# Patient Record
Sex: Female | Born: 1975 | Race: Black or African American | Hispanic: No | Marital: Married | Smoking: Never smoker
Health system: Southern US, Community
[De-identification: ages and names within clinical notes are randomized; demographics above are authoritative.]

## PROBLEM LIST (undated history)

## (undated) ENCOUNTER — Ambulatory Visit (HOSPITAL_COMMUNITY): Admission: EM | Payer: Managed Care, Other (non HMO)

## (undated) ENCOUNTER — Inpatient Hospital Stay (HOSPITAL_COMMUNITY): Payer: Self-pay

## (undated) DIAGNOSIS — Z789 Other specified health status: Secondary | ICD-10-CM

## (undated) DIAGNOSIS — T4145XA Adverse effect of unspecified anesthetic, initial encounter: Secondary | ICD-10-CM

## (undated) DIAGNOSIS — T8859XA Other complications of anesthesia, initial encounter: Secondary | ICD-10-CM

## (undated) HISTORY — PX: TUBAL LIGATION: SHX77

---

## 2009-07-14 ENCOUNTER — Ambulatory Visit (HOSPITAL_COMMUNITY): Admission: RE | Admit: 2009-07-14 | Discharge: 2009-07-14 | Payer: Self-pay | Admitting: Obstetrics and Gynecology

## 2010-03-29 ENCOUNTER — Encounter: Payer: Self-pay | Admitting: Obstetrics and Gynecology

## 2011-01-12 LAB — OB RESULTS CONSOLE ABO/RH

## 2011-01-12 LAB — OB RESULTS CONSOLE ANTIBODY SCREEN: Antibody Screen: NEGATIVE

## 2011-02-22 ENCOUNTER — Encounter (HOSPITAL_COMMUNITY): Payer: Self-pay

## 2011-02-22 ENCOUNTER — Inpatient Hospital Stay (HOSPITAL_COMMUNITY)
Admission: AD | Admit: 2011-02-22 | Discharge: 2011-02-22 | Disposition: A | Discharge status: Home / Self Care | Payer: Managed Care, Other (non HMO) | Source: Ambulatory Visit | Attending: Obstetrics and Gynecology | Admitting: Obstetrics and Gynecology

## 2011-02-22 DIAGNOSIS — O210 Mild hyperemesis gravidarum: Secondary | ICD-10-CM | POA: Insufficient documentation

## 2011-02-22 DIAGNOSIS — R1013 Epigastric pain: Secondary | ICD-10-CM | POA: Insufficient documentation

## 2011-02-22 HISTORY — DX: Other specified health status: Z78.9

## 2011-02-22 LAB — CBC
MCHC: 33.8 g/dL (ref 30.0–36.0)
Platelets: 178 10*3/uL (ref 150–400)
RDW: 12.8 % (ref 11.5–15.5)
WBC: 3.7 10*3/uL — ABNORMAL LOW (ref 4.0–10.5)

## 2011-02-22 LAB — URINALYSIS, ROUTINE W REFLEX MICROSCOPIC
Leukocytes, UA: NEGATIVE
Protein, ur: 100 mg/dL — AB
Specific Gravity, Urine: 1.025 (ref 1.005–1.030)
pH: 6.5 (ref 5.0–8.0)

## 2011-02-22 LAB — COMPREHENSIVE METABOLIC PANEL
ALT: 61 U/L — ABNORMAL HIGH (ref 0–35)
AST: 56 U/L — ABNORMAL HIGH (ref 0–37)
Albumin: 3.1 g/dL — ABNORMAL LOW (ref 3.5–5.2)
Alkaline Phosphatase: 83 U/L (ref 39–117)
BUN: 5 mg/dL — ABNORMAL LOW (ref 6–23)
CO2: 23 mEq/L (ref 19–32)
Calcium: 9.3 mg/dL (ref 8.4–10.5)
Chloride: 96 mEq/L (ref 96–112)
Creatinine, Ser: 0.54 mg/dL (ref 0.50–1.10)
GFR calc Af Amer: >90 mL/min (ref >90)
GFR calc non Af Amer: >90 mL/min (ref >90)
Glucose, Bld: 84 mg/dL (ref 70–99)
Potassium: 3.2 mEq/L — ABNORMAL LOW (ref 3.5–5.1)
Sodium: 132 mEq/L — ABNORMAL LOW (ref 135–145)
Total Bilirubin: 0.4 mg/dL (ref 0.3–1.2)
Total Protein: 7.4 g/dL (ref 6.0–8.3)

## 2011-02-22 LAB — URINE MICROSCOPIC-ADD ON

## 2011-02-22 MED ORDER — RANITIDINE HCL 150 MG PO CAPS: 150.0000 mg | ORAL_CAPSULE | Freq: Every day | ORAL | Status: DC

## 2011-02-22 MED ORDER — ONDANSETRON HCL 4 MG/2ML IJ SOLN
4.0000 mg | Freq: Once | INTRAMUSCULAR | Status: AC
  Administered 2011-02-22: 4 mg via INTRAVENOUS
  Filled 2011-02-22: qty 2

## 2011-02-22 MED ORDER — LACTATED RINGERS IV BOLUS (SEPSIS)
1000.0000 mL | Freq: Once | INTRAVENOUS | Status: AC
  Administered 2011-02-22: 1000 mL via INTRAVENOUS

## 2011-02-22 MED ORDER — FAMOTIDINE IN NACL 20-0.9 MG/50ML-% IV SOLN
20.0000 mg | Freq: Once | INTRAVENOUS | Status: AC
  Administered 2011-02-22: 20 mg via INTRAVENOUS
  Filled 2011-02-22: qty 50

## 2011-02-22 NOTE — ED Provider Notes (Signed)
History     CSN: [id number] Arrival date & time: 02/22/2011  2:49 PM   None     Chief Complaint  Patient presents with  . Dehydration    HPI Shawna Morris is a 35 y.o. female @ [redacted]w[redacted]d gestation who presents to MAU for dehydration. She states that she has had nausea and vomiting since early pregnancy but today the medication has not helped.  Past Medical History  Diagnosis Date  . No pertinent past medical history     Past Surgical History  Procedure Date  . Cesarean section     History reviewed. No pertinent family history.  History  Substance Use Topics  . Smoking status: Never Smoker   . Smokeless tobacco: Not on file  . Alcohol Use: No    OB History    Grav Para Term Preterm Abortions TAB SAB Ect Mult Living   4 3 3       3       Review of Systems  Constitutional: Positive for appetite change. Negative for fever, chills and activity change.  HENT: Negative.   Eyes: Negative.   Respiratory: Negative.   Cardiovascular: Negative.   Gastrointestinal: Positive for nausea and vomiting. Negative for diarrhea and constipation.       Epigastric pain.  Genitourinary: Positive for frequency. Negative for dysuria, vaginal bleeding, vaginal discharge and pelvic pain.       [redacted] weeks gestation.  Skin: Negative.   Neurological: Positive for light-headedness.  Psychiatric/Behavioral: Negative for confusion and agitation. The patient is not nervous/anxious.     Allergies  Aspirin  Home Medications  No current outpatient prescriptions on file.  BP 98/59  Pulse 85  Temp(Src) 99.2 F (37.3 C) (Oral)  Resp 18  Ht 5\' 6"  (1.676 m)  Wt 177 lb 6 oz (80.457 kg)  BMI 28.63 kg/m2  Physical Exam  Nursing note and vitals reviewed. Constitutional: She is oriented to person, place, and time. She appears well-developed and well-nourished.  HENT:  Head: Normocephalic and atraumatic.  Eyes: EOM are normal.  Neck: Neck supple.  Cardiovascular: Normal rate.     Pulmonary/Chest: Effort normal.  Abdominal: There is tenderness in the epigastric area.  Musculoskeletal: Normal range of motion.  Neurological: She is alert and oriented to person, place, and time. No cranial nerve deficit.  Skin: Skin is warm and dry.  Psychiatric: She has a normal mood and affect. Her behavior is normal. Judgment and thought content normal.   Assessment: Hyperemesis @ [redacted] weeks gestation   Epigastric pain  Plan:  IV Fluids   Antiemetics   Pepcid   ED Course  Procedures   MDM          Kerrie Buffalo, NP 02/22/11 1937

## 2011-02-22 NOTE — Progress Notes (Signed)
Pt having much spitting and vomiting today.  Called office and was told to come here for IV fluids.

## 2011-02-22 NOTE — Progress Notes (Signed)
Pt states n/v x1week, ptyalism, has dull h/a at present. Here for iv fluids.

## 2011-06-25 ENCOUNTER — Inpatient Hospital Stay (HOSPITAL_COMMUNITY): Payer: BC Managed Care – PPO

## 2011-06-25 ENCOUNTER — Encounter (HOSPITAL_COMMUNITY): Payer: Self-pay

## 2011-06-25 ENCOUNTER — Inpatient Hospital Stay (HOSPITAL_COMMUNITY)
Admission: AD | Admit: 2011-06-25 | Discharge: 2011-06-25 | Disposition: A | Discharge status: Home / Self Care | Payer: BC Managed Care – PPO | Source: Ambulatory Visit | Attending: Obstetrics and Gynecology | Admitting: Obstetrics and Gynecology

## 2011-06-25 DIAGNOSIS — D539 Nutritional anemia, unspecified: Secondary | ICD-10-CM | POA: Insufficient documentation

## 2011-06-25 DIAGNOSIS — R112 Nausea with vomiting, unspecified: Secondary | ICD-10-CM

## 2011-06-25 DIAGNOSIS — O99019 Anemia complicating pregnancy, unspecified trimester: Secondary | ICD-10-CM | POA: Insufficient documentation

## 2011-06-25 DIAGNOSIS — O212 Late vomiting of pregnancy: Secondary | ICD-10-CM | POA: Insufficient documentation

## 2011-06-25 LAB — COMPREHENSIVE METABOLIC PANEL
AST: 15 U/L (ref 0–37)
BUN: 4 mg/dL — ABNORMAL LOW (ref 6–23)
CO2: 23 mEq/L (ref 19–32)
Calcium: 8.6 mg/dL (ref 8.4–10.5)
Chloride: 103 mEq/L (ref 96–112)
Creatinine, Ser: 0.61 mg/dL (ref 0.50–1.10)
GFR calc non Af Amer: >90 mL/min (ref >90)
Total Bilirubin: 0.5 mg/dL (ref 0.3–1.2)

## 2011-06-25 LAB — T3, FREE: T3, Free: 2.6 pg/mL (ref 2.3–4.2)

## 2011-06-25 LAB — TSH: TSH: 1.277 u[IU]/mL (ref 0.350–4.500)

## 2011-06-25 LAB — CBC
HCT: 47.6 % — ABNORMAL HIGH (ref 36.0–46.0)
MCH: 26.8 pg (ref 26.0–34.0)
MCV: 133 fL — ABNORMAL HIGH (ref 78.0–100.0)
Platelets: 207 10*3/uL (ref 150–400)
RDW: 16.1 % — ABNORMAL HIGH (ref 11.5–15.5)
WBC: 5.8 10*3/uL (ref 4.0–10.5)

## 2011-06-25 LAB — T4, FREE: Free T4: 1.12 ng/dL (ref 0.80–1.80)

## 2011-06-25 MED ORDER — PROMETHAZINE HCL 25 MG PO TABS: 25.0000 mg | ORAL_TABLET | Freq: Four times a day (QID) | ORAL | Status: DC | PRN

## 2011-06-25 MED ORDER — LACTATED RINGERS IV SOLN
Freq: Once | INTRAVENOUS | Status: AC
  Administered 2011-06-25: 12:00:00 via INTRAVENOUS

## 2011-06-25 NOTE — Discharge Instructions (Signed)
Anemia, Frequently Asked Questions WHAT ARE THE SYMPTOMS OF ANEMIA?  Headache.   Difficulty thinking.   Fatigue.   Shortness of breath.   Weakness.   Rapid heartbeat.  AT WHAT POINT ARE PEOPLE CONSIDERED ANEMIC?  This varies with gender and age.   Both hemoglobin (Hgb) and hematocrit values are used to define anemia. These lab values are obtained from a complete blood count (CBC) test. This is performed at a caregiver's office.   The normal range of hemoglobin values for adult men is 14.0 g/dL to 17.4 g/dL. For nonpregnant women, values are 12.3 g/dL to 15.3 g/dL.   The World Health Organization defines anemia as less than 12 g/dL for nonpregnant women and less than 13 g/dL for men.   For adult males, the average normal hematocrit is 46%, and the range is 40% to 52%.   For adult females, the average normal hematocrit is 41%, and the range is 35% to 47%.   Values that fall below the lower limits can be a sign of anemia and should have further checking (evaluation).  GROUPS OF PEOPLE WHO ARE AT RISK FOR DEVELOPING ANEMIA INCLUDE:   Infants who are breastfed or taking a formula that is not fortified with iron.   Children going through a rapid growth spurt. The iron available can not keep up with the needs for a red cell mass which must grow with the child.   Women in childbearing years. They need iron because of blood loss during menstruation.   Pregnant women. The growing fetus creates a high demand for iron.   People with ongoing gastrointestinal blood loss are at risk of developing iron deficiency.   Individuals with leukemia or cancer who must receive chemotherapy or radiation to treat their disease. The drugs or radiation used to treat these diseases often decreases the bone marrow's ability to make cells of all classes. This includes red blood cells, white blood cells, and platelets.   Individuals with chronic inflammatory conditions such as rheumatoid arthritis or  chronic infections.   The elderly.  ARE SOME TYPES OF ANEMIA INHERITED?   Yes, some types of anemia are due to inherited or genetic defects.   Sickle cell anemia. This occurs most often in people of African, African American, and Mediterranean descent.   Thalassemia (or Cooley's anemia). This type is found in people of Mediterranean and Southeast Asian descent. These types of anemia are common.   [place]. This is rare.  CAN CERTAIN MEDICATIONS CAUSE A PERSON TO BECOME ANEMIC?  Yes. For example, drugs to fight cancer (chemotherapeutic agents) often cause anemia. These drugs can slow the bone marrow's ability to make red blood cells. If there are not enough red blood cells, the body does not get enough oxygen. WHAT HEMATOCRIT LEVEL IS REQUIRED TO DONATE BLOOD?  The lower limit of an acceptable hematocrit for blood donors is 38%. If you have a low hematocrit value, you should schedule an appointment with your caregiver. ARE BLOOD TRANSFUSIONS COMMONLY USED TO CORRECT ANEMIA, AND ARE THEY DANGEROUS?  They are used to treat anemia as a last resort. Your caregiver will find the cause of the anemia and correct it if possible. Most blood transfusions are given because of excessive bleeding at the time of surgery, with trauma, or because of bone marrow suppression in patients with cancer or leukemia on chemotherapy. Blood transfusions are safer than ever before. We also know that blood transfusions affect the immune system and may increase certain risks. There is   also a concern for human error. In 1/16,000 transfusions, a patient receives a transfusion of blood that is not matched with his or her blood type.  WHAT IS IRON DEFICIENCY ANEMIA AND CAN I CORRECT IT BY CHANGING MY DIET?  Iron is an essential part of hemoglobin. Without enough hemoglobin, anemia develops and the body does not get the right amount of oxygen. Iron deficiency anemia develops after the body has had a low level of iron for a long  time. This is either caused by blood loss, not taking in or absorbing enough iron, or increased demands for iron (like pregnancy or rapid growth).  Foods from animal origin such as beef, chicken, and pork, are good sources of iron. Be sure to have one of these foods at each meal. Vitamin C helps your body absorb iron. Foods rich in Vitamin C include citrus, bell pepper, strawberries, spinach and cantaloupe. In some cases, iron supplements may be needed in order to correct the iron deficiency. In the case of poor absorption, extra iron may have to be given directly into the vein through a needle (intravenously). I HAVE BEEN DIAGNOSED WITH IRON DEFICIENCY ANEMIA AND MY CAREGIVER PRESCRIBED IRON SUPPLEMENTS. HOW LONG WILL IT TAKE FOR MY BLOOD TO BECOME NORMAL?  It depends on the degree of anemia at the beginning of treatment. Most people with mild to moderate iron deficiency, anemia will correct the anemia over a period of 2 to 3 months. But after the anemia is corrected, the iron stored by the body is still low. Caregivers often suggest an additional 6 months of oral iron therapy once the anemia has been reversed. This will help prevent the iron deficiency anemia from quickly happening again. Non-anemic adult males should take iron supplements only under the direction of a doctor, too much iron can cause liver damage.  MY HEMOGLOBIN IS 9 G/DL AND I AM SCHEDULED FOR SURGERY. SHOULD I POSTPONE THE SURGERY?  If you have Hgb of 9, you should discuss this with your caregiver right away. Many patients with similar hemoglobin levels have had surgery without problems. If minimal blood loss is expected for a minor procedure, no treatment may be necessary.  If a greater blood loss is expected for more extensive procedures, you should ask your caregiver about being treated with erythropoietin and iron. This is to accelerate the recovery of your hemoglobin to a normal level before surgery. An anemic patient who undergoes  high-blood-loss surgery has a greater risk of surgical complications and need for a blood transfusion, which also carries some risk.  I HAVE BEEN TOLD THAT HEAVY MENSTRUAL PERIODS CAUSE ANEMIA. IS THERE ANYTHING I CAN DO TO PREVENT THE ANEMIA?  Anemia that results from heavy periods is usually due to iron deficiency. You can try to meet the increased demands for iron caused by the heavy monthly blood loss by increasing the intake of iron-rich foods. Iron supplements may be required. Discuss your concerns with your caregiver. WHAT CAUSES ANEMIA DURING PREGNANCY?  Pregnancy places major demands on the body. The mother must meet the needs of both her body and her growing baby. The body needs enough iron and folate to make the right amount of red blood cells. To prevent anemia while pregnant, the mother should stay in close contact with her caregiver.  Be sure to eat a diet that has foods rich in iron and folate like liver and dark green leafy vegetables. Folate plays an important role in the normal development of a baby's   spinal cord. Folate can help prevent serious disorders like spina bifida. If your diet does not provide adequate nutrients, you may want to talk with your caregiver about nutritional supplements.  WHAT IS THE RELATIONSHIP BETWEEN FIBROID TUMORS AND ANEMIA IN WOMEN?  The relationship is usually caused by the increased menstrual blood loss caused by fibroids. Good iron intake may be required to prevent iron deficiency anemia from developing.  Document Released: 10/01/2003 Document Revised: 02/11/2011 Document Reviewed: 03/17/2010 [facility] Patient Information 2012 [place], [place].  B.R.A.T. Diet Your doctor has recommended the B.R.A.T. diet for you or your child until the condition improves. This is often used to help control diarrhea and vomiting symptoms. If you or your child can tolerate clear liquids, you may have:  Bananas.   Rice.   Applesauce.   Toast (and other simple starches  such as crackers, potatoes, noodles).  Be sure to avoid dairy products, meats, and fatty foods until symptoms are better. Fruit juices such as apple, grape, and prune juice can make diarrhea worse. Avoid these. Continue this diet for 2 days or as instructed by your caregiver. Document Released: 02/22/2005 Document Revised: 02/11/2011 Document Reviewed: 08/11/2006 [facility] Patient Information 2012 [place], [place].

## 2011-06-25 NOTE — MAU Provider Note (Signed)
History     CSN: [id number]  Arrival date and time: 06/25/11 1108   First Provider Initiated Contact with Patient 06/25/11 1200      No chief complaint on file.  HPI This is a 36 y.o. female G3 P2 at [redacted]w[redacted]d who presents after being evaluated in the office by Dr Henderson Cloud for c/o vomiting for 2 weeks, ptyalism, Difficulty catching her breath this morning (no longer feels this way), and weakness.  Lost 2 lbs as of this morning.   OB History    Grav Para Term Preterm Abortions TAB SAB Ect Mult Living   3 2 2  0 0 0 0 0 1 3      Past Medical History  Diagnosis Date  . No pertinent past medical history     Past Surgical History  Procedure Date  . Cesarean section     Family History  Problem Relation Age of Onset  . Anesthesia problems Neg Hx     History  Substance Use Topics  . Smoking status: Never Smoker   . Smokeless tobacco: Not on file  . Alcohol Use: No    Allergies:  Allergies  Allergen Reactions  . Aspirin Other (See Comments)    bleeding    Prescriptions prior to admission  Medication Sig Dispense Refill  . acetaminophen (TYLENOL) 325 MG tablet Take 650 mg by mouth every 6 (six) hours as needed. pain       . prenatal vitamin w/FE, FA (PRENATAL 1 + 1) 27-1 MG TABS Take 1 tablet by mouth daily.          Review of Systems  Constitutional: Positive for weight loss and malaise/fatigue. Negative for fever, chills and diaphoresis.  Gastrointestinal: Positive for nausea and vomiting. Negative for abdominal pain.  Genitourinary: Negative for dysuria.  Neurological: Positive for weakness.    Physical Exam   Blood pressure 113/88, pulse 90, temperature 97.6 F (36.4 C), temperature source Oral, resp. rate 16, height 5\' 6"  (1.676 m), weight 185 lb 3.2 oz (84.006 kg), SpO2 100.00%.  Physical Exam  Constitutional: She is oriented to person, place, and time. She appears well-developed and well-nourished. No distress.  HENT:  Head: Normocephalic.    Cardiovascular: Normal rate.   Respiratory: Effort normal.  GI: Soft. She exhibits no distension and no mass. There is no tenderness. There is no rebound and no guarding.  Musculoskeletal: Normal range of motion.  Neurological: She is alert and oriented to person, place, and time.  Skin: Skin is warm and dry.  Psychiatric: She has a normal mood and affect.   O2 sat 100%  Does not appear dyspneic FHR reassuring Irregular contractions and irritability, every 5-7 minutes, patient does not feel them Cervix Closed/Long/High  Results for orders placed during the hospital encounter of 06/25/11 (from the past 24 hour(s))  CBC     Status: Abnormal   Collection Time   06/25/11 11:55 AM      Component Value Range   WBC 5.8  4.0 - 10.5 (K/uL)   RBC 3.58 (*) 3.87 - 5.11 (MIL/uL)   Hemoglobin 9.6 (*) 12.0 - 15.0 (g/dL)   HCT 14.7 (*) 82.9 - 46.0 (%)   MCV 133.0 (*) 78.0 - 100.0 (fL)   MCH 26.8  26.0 - 34.0 (pg)   MCHC 20.2 (*) 30.0 - 36.0 (g/dL)   RDW 56.2 (*) 13.0 - 15.5 (%)   Platelets 207  150 - 400 (K/uL)  COMPREHENSIVE METABOLIC PANEL     Status: Abnormal  Collection Time   06/25/11 11:55 AM      Component Value Range   Sodium 135  135 - 145 (mEq/L)   Potassium 3.7  3.5 - 5.1 (mEq/L)   Chloride 103  96 - 112 (mEq/L)   CO2 23  19 - 32 (mEq/L)   Glucose, Bld 65 (*) 70 - 99 (mg/dL)   BUN 4 (*) 6 - 23 (mg/dL)   Creatinine, Ser 7.82  0.50 - 1.10 (mg/dL)   Calcium 8.6  8.4 - 95.6 (mg/dL)   Total Protein 6.1  6.0 - 8.3 (g/dL)   Albumin 2.5 (*) 3.5 - 5.2 (g/dL)   AST 15  0 - 37 (U/L)   ALT 8  0 - 35 (U/L)   Alkaline Phosphatase 108  39 - 117 (U/L)   Total Bilirubin 0.5  0.3 - 1.2 (mg/dL)   GFR calc non Af Amer >90  >90 (mL/min)   GFR calc Af Amer >90  >90 (mL/min)    Thyroid Scan from 2011:   Clinical Data: Evaluate thyroid nodules  THYROID ULTRASOUND  Technique: Ultrasound examination of the thyroid gland and  adjacent soft tissues was performed.  Comparison: None  Findings:  The right lobe of the thyroid gland measures 5.3 x 1.4 x  2.3 cm.  The left lobe of the thyroid gland measures 4.9 x 1.3 x 2.1 cm.  The isthmus measures 4 mm.  The echotexture of the thyroid gland is homogeneous.  There are multiple hypo echoic nodules with central echogenic foci  demonstrating ring down artifact. The largest is in the upper  pole of the right lobe measuring 1.1 x 0.6 x 0.5 cm. There are no  microcalcifications associated with these nodules. There is no  hypervascularity associated with these nodules.  IMPRESSION:  1. 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  2. No suspicious nodules or masses noted.    MAU Course  Procedures  MDM Per Dr Henderson Cloud:  Will check [place], Give IV, and check labs  Assessment and Plan  A:  Nausea Improved, Hungry      Macrocytic, Hypochromic Anemia      No further Respiratory issue  P: Discussed results with Dr Henderson Cloud.       Will check Thyroid Panel due to macrocytic anemia      Recommend continue Iron       Discharge home      Followup in office        [facility] 06/25/2011, 12:01 PM

## 2011-06-25 NOTE — MAU Note (Signed)
Patient states she is scheduled for a repeat cesarean section on 6-4.

## 2011-06-25 NOTE — MAU Note (Signed)
Patient states she has been having vomiting in the morning for about 2 weeks. Has also been having difficulty "catching her breath" for the same 2 weeks. States she was seen in the office today by Dr. Henderson Cloud and sent to MAU for evaluation.

## 2011-07-16 ENCOUNTER — Inpatient Hospital Stay (HOSPITAL_COMMUNITY): Admission: AD | Admit: 2011-07-16 | Payer: Managed Care, Other (non HMO) | Admitting: Obstetrics and Gynecology

## 2011-07-26 ENCOUNTER — Encounter (HOSPITAL_COMMUNITY): Payer: Self-pay | Admitting: Pharmacist

## 2011-08-03 ENCOUNTER — Encounter (HOSPITAL_COMMUNITY): Payer: Self-pay

## 2011-08-04 ENCOUNTER — Encounter (HOSPITAL_COMMUNITY): Payer: Self-pay

## 2011-08-04 ENCOUNTER — Encounter (HOSPITAL_COMMUNITY)
Admission: RE | Admit: 2011-08-04 | Discharge: 2011-08-04 | Disposition: A | Discharge status: Home / Self Care | Payer: BC Managed Care – PPO | Source: Ambulatory Visit | Attending: Obstetrics and Gynecology | Admitting: Obstetrics and Gynecology

## 2011-08-04 HISTORY — DX: Other complications of anesthesia, initial encounter: T88.59XA

## 2011-08-04 HISTORY — DX: Adverse effect of unspecified anesthetic, initial encounter: T41.45XA

## 2011-08-04 LAB — CBC
MCHC: 31.4 g/dL (ref 30.0–36.0)
Platelets: 215 10*3/uL (ref 150–400)
RDW: 15.7 % — ABNORMAL HIGH (ref 11.5–15.5)
WBC: 5.9 10*3/uL (ref 4.0–10.5)

## 2011-08-04 LAB — SURGICAL PCR SCREEN
MRSA, PCR: NEGATIVE
Staphylococcus aureus: NEGATIVE

## 2011-08-04 LAB — RPR: RPR Ser Ql: NONREACTIVE

## 2011-08-04 NOTE — Patient Instructions (Addendum)
20 Shawna Morris  08/04/2011   Your procedure is scheduled on:  08/12/11  Enter through the Main Entrance of [facility] at 6 AM.  Pick up the phone at the desk and dial [phone].   Call this number if you have problems the morning of surgery: [phone]   Remember:   Do not eat food:After Midnight.  Do not drink clear liquids: After Midnight.  Take these medicines the morning of surgery with A SIP OF WATER: NA   Do not wear jewelry, make-up or nail polish.  Do not wear lotions, powders, or perfumes. You may wear deodorant.  Do not shave 48 hours prior to surgery.  Do not bring valuables to the hospital.  Contacts, dentures or bridgework may not be worn into surgery.  Leave suitcase in the car. After surgery it may be brought to your room.  For patients admitted to the hospital, checkout time is 11:00 AM the day of discharge.   Patients discharged the day of surgery will not be allowed to drive home.  Name and phone number of your driver: NA  Special Instructions: CHG Shower Use Special Wash: 1/2 bottle night before surgery and 1/2 bottle morning of surgery.   Please read over the following fact sheets that you were given: MRSA Information

## 2011-08-10 NOTE — H&P (Signed)
William Laske is a 36 y.o. female presenting for repeat cesarean section at 39.5 weeks. Two prior c/s. PNC uncomplicated.  Negative GBS. Desires permanent sterilization. Maternal Medical History:  Prenatal Complications - Diabetes: none.    OB History    Grav Para Term Preterm Abortions TAB SAB Ect Mult Living   3 2 2  0 0 0 0 0 1 3     Past Medical History  Diagnosis Date  . No pertinent past medical history   . Complication of anesthesia     states "last epidural did not work" wants to be "asleep"   Past Surgical History  Procedure Date  . Cesarean section    Family History: family history is negative for Anesthesia problems. Social History:  reports that she has never smoked. She does not have any smokeless tobacco history on file. She reports that she does not drink alcohol or use illicit drugs.  Review of Systems  All other systems reviewed and are negative.      There were no vitals taken for this visit. Maternal Exam:  Abdomen: Patient reports no abdominal tenderness. Surgical scars: low transverse.   Fundal height is c/w dates.   Estimated fetal weight is 7.5.   Fetal presentation: vertex     Physical Exam  Constitutional: She is oriented to person, place, and time. She appears well-developed and well-nourished.  HENT:  Head: Normocephalic.  Mouth/Throat: Oropharynx is clear and moist.  Eyes: Conjunctivae and EOM are normal. Pupils are equal, round, and reactive to light.  Neck: Normal range of motion.  Cardiovascular: Normal rate, regular rhythm and normal heart sounds.   Respiratory: Effort normal and breath sounds normal.  GI: Bowel sounds are normal.  Neurological: She is alert and oriented to person, place, and time. She has normal reflexes.    Prenatal labs: ABO, Rh: B/Positive/-- (11/06 0000) Antibody: Negative (11/06 0000) Rubella: Immune (11/06 0000) RPR: NON REACTIVE (05/29 1332)  HBsAg: Negative (11/06 0000)  HIV: Non-reactive (11/06 0000)   GBS:     Assessment/Plan: IUP at 39.5 for repeat cesarean section.  Desires sterility procede with repeat cesarean section at BTL. Risk of cesarean section discussed.  These include:  Risk of infection;  Risk of hemorrhage that could require transfusions with the associated risk of aids or hepatitis;  Excessive bleeding could require hysterectomy;  Risk of injury to adjacent organs including bladder, bowel or ureters;  Risk of DVT's and possible pulmonary embolus.  Patient expresses a understanding of indications and risks.; Alternative forms of birth control  Discussed. Potential irreversibility of sterilization explained.  Failure rate discussed which can be in the form of an ectopic pregnancy requiring suregery. Patient expresses understanding.  Alta Shober S 08/10/2011, 7:59 PM

## 2011-08-11 ENCOUNTER — Encounter (HOSPITAL_COMMUNITY)
Admission: AD | Disposition: A | Discharge status: Home / Self Care | Payer: Self-pay | Source: Ambulatory Visit | Attending: Obstetrics and Gynecology

## 2011-08-11 ENCOUNTER — Encounter (HOSPITAL_COMMUNITY): Payer: Self-pay | Admitting: Anesthesiology

## 2011-08-11 ENCOUNTER — Inpatient Hospital Stay (HOSPITAL_COMMUNITY)
Admission: AD | Admit: 2011-08-11 | Discharge: 2011-08-14 | DRG: 371 | Disposition: A | Discharge status: Home / Self Care | Payer: BC Managed Care – PPO | Source: Ambulatory Visit | Attending: Obstetrics and Gynecology | Admitting: Obstetrics and Gynecology

## 2011-08-11 ENCOUNTER — Encounter (HOSPITAL_COMMUNITY): Payer: Self-pay | Admitting: *Deleted

## 2011-08-11 ENCOUNTER — Inpatient Hospital Stay (HOSPITAL_COMMUNITY): Payer: BC Managed Care – PPO | Admitting: Anesthesiology

## 2011-08-11 DIAGNOSIS — Z302 Encounter for sterilization: Secondary | ICD-10-CM

## 2011-08-11 DIAGNOSIS — O34219 Maternal care for unspecified type scar from previous cesarean delivery: Principal | ICD-10-CM | POA: Diagnosis present

## 2011-08-11 LAB — CBC
HCT: 31.9 % — ABNORMAL LOW (ref 36.0–46.0)
MCV: 82 fL (ref 78.0–100.0)
RBC: 3.89 MIL/uL (ref 3.87–5.11)
RDW: 16.6 % — ABNORMAL HIGH (ref 11.5–15.5)
WBC: 6.6 10*3/uL (ref 4.0–10.5)

## 2011-08-11 SURGERY — Surgical Case
Anesthesia: Spinal | Site: Abdomen | Wound class: Clean Contaminated

## 2011-08-11 MED ORDER — WITCH HAZEL-GLYCERIN EX PADS: 1.0000 "application " | MEDICATED_PAD | CUTANEOUS | Status: DC | PRN

## 2011-08-11 MED ORDER — EPHEDRINE SULFATE 50 MG/ML IJ SOLN
INTRAMUSCULAR | Status: DC | PRN
  Administered 2011-08-11: 5 mg via INTRAVENOUS
  Administered 2011-08-11: 10 mg via INTRAVENOUS
  Administered 2011-08-11: 15 mg via INTRAVENOUS
  Administered 2011-08-11: 5 mg via INTRAVENOUS
  Administered 2011-08-11 (×2): 10 mg via INTRAVENOUS
  Administered 2011-08-11: 5 mg via INTRAVENOUS

## 2011-08-11 MED ORDER — CEFAZOLIN SODIUM 1-5 GM-% IV SOLN
1.0000 g | INTRAVENOUS | Status: AC
  Administered 2011-08-11: 1 g via INTRAVENOUS
  Filled 2011-08-11: qty 50

## 2011-08-11 MED ORDER — TETANUS-DIPHTH-ACELL PERTUSSIS 5-2.5-18.5 LF-MCG/0.5 IM SUSP
0.5000 mL | Freq: Once | INTRAMUSCULAR | Status: AC
  Administered 2011-08-12: 0.5 mL via INTRAMUSCULAR
  Filled 2011-08-11: qty 0.5

## 2011-08-11 MED ORDER — MEASLES, MUMPS & RUBELLA VAC ~~LOC~~ INJ: 0.5000 mL | INJECTION | Freq: Once | SUBCUTANEOUS | Status: DC

## 2011-08-11 MED ORDER — METOCLOPRAMIDE HCL 5 MG/ML IJ SOLN: 10.0000 mg | Freq: Three times a day (TID) | INTRAMUSCULAR | Status: DC | PRN

## 2011-08-11 MED ORDER — SIMETHICONE 80 MG PO CHEW
80.0000 mg | CHEWABLE_TABLET | Freq: Three times a day (TID) | ORAL | Status: DC
  Administered 2011-08-12 – 2011-08-14 (×7): 80 mg via ORAL

## 2011-08-11 MED ORDER — LANOLIN HYDROUS EX OINT: 1.0000 "application " | TOPICAL_OINTMENT | CUTANEOUS | Status: DC | PRN

## 2011-08-11 MED ORDER — FENTANYL CITRATE 0.05 MG/ML IJ SOLN
INTRAMUSCULAR | Status: DC | PRN
  Administered 2011-08-11: 25 ug via INTRATHECAL

## 2011-08-11 MED ORDER — NALOXONE HCL 0.4 MG/ML IJ SOLN: 0.4000 mg | INTRAMUSCULAR | Status: DC | PRN

## 2011-08-11 MED ORDER — PHENYLEPHRINE HCL 10 MG/ML IJ SOLN
INTRAMUSCULAR | Status: DC | PRN
  Administered 2011-08-11 (×2): 80 ug via INTRAVENOUS
  Administered 2011-08-11: 40 ug via INTRAVENOUS

## 2011-08-11 MED ORDER — DIPHENHYDRAMINE HCL 50 MG/ML IJ SOLN: 12.5000 mg | INTRAMUSCULAR | Status: DC | PRN

## 2011-08-11 MED ORDER — LACTATED RINGERS IV SOLN
INTRAVENOUS | Status: DC | PRN
  Administered 2011-08-11 (×3): via INTRAVENOUS

## 2011-08-11 MED ORDER — LACTATED RINGERS IV SOLN
INTRAVENOUS | Status: DC
  Administered 2011-08-11 – 2011-08-12 (×3): via INTRAVENOUS

## 2011-08-11 MED ORDER — SODIUM CHLORIDE 0.9 % IV SOLN
1.0000 ug/kg/h | INTRAVENOUS | Status: DC | PRN
  Filled 2011-08-11: qty 2.5

## 2011-08-11 MED ORDER — ONDANSETRON HCL 4 MG/2ML IJ SOLN: 4.0000 mg | Freq: Three times a day (TID) | INTRAMUSCULAR | Status: DC | PRN

## 2011-08-11 MED ORDER — NALBUPHINE SYRINGE 5 MG/0.5 ML
5.0000 mg | INJECTION | INTRAMUSCULAR | Status: DC | PRN
  Filled 2011-08-11: qty 1

## 2011-08-11 MED ORDER — MENTHOL 3 MG MT LOZG: 1.0000 | LOZENGE | OROMUCOSAL | Status: DC | PRN

## 2011-08-11 MED ORDER — DIPHENHYDRAMINE HCL 25 MG PO CAPS
25.0000 mg | ORAL_CAPSULE | ORAL | Status: DC | PRN
  Filled 2011-08-11 (×2): qty 1

## 2011-08-11 MED ORDER — SODIUM CHLORIDE 0.9 % IJ SOLN
3.0000 mL | INTRAMUSCULAR | Status: DC | PRN
  Administered 2011-08-12: 3 mL via INTRAVENOUS

## 2011-08-11 MED ORDER — SIMETHICONE 80 MG PO CHEW: 80.0000 mg | CHEWABLE_TABLET | ORAL | Status: DC | PRN

## 2011-08-11 MED ORDER — NALBUPHINE SYRINGE 5 MG/0.5 ML
5.0000 mg | INJECTION | INTRAMUSCULAR | Status: DC | PRN
  Administered 2011-08-12: 10 mg via INTRAVENOUS
  Filled 2011-08-11 (×2): qty 1

## 2011-08-11 MED ORDER — DIPHENHYDRAMINE HCL 50 MG/ML IJ SOLN: 25.0000 mg | INTRAMUSCULAR | Status: DC | PRN

## 2011-08-11 MED ORDER — SENNOSIDES-DOCUSATE SODIUM 8.6-50 MG PO TABS
2.0000 | ORAL_TABLET | Freq: Every day | ORAL | Status: DC
  Administered 2011-08-12 – 2011-08-13 (×2): 2 via ORAL

## 2011-08-11 MED ORDER — PRENATAL MULTIVITAMIN CH
1.0000 | ORAL_TABLET | Freq: Every day | ORAL | Status: DC
  Filled 2011-08-11 (×3): qty 1

## 2011-08-11 MED ORDER — MEDROXYPROGESTERONE ACETATE 150 MG/ML IM SUSP: 150.0000 mg | INTRAMUSCULAR | Status: DC | PRN

## 2011-08-11 MED ORDER — MORPHINE SULFATE (PF) 0.5 MG/ML IJ SOLN
INTRAMUSCULAR | Status: DC | PRN
  Administered 2011-08-11: .5 mg via INTRATHECAL

## 2011-08-11 MED ORDER — DIBUCAINE 1 % RE OINT: 1.0000 "application " | TOPICAL_OINTMENT | RECTAL | Status: DC | PRN

## 2011-08-11 MED ORDER — CITRIC ACID-SODIUM CITRATE 334-500 MG/5ML PO SOLN
30.0000 mL | Freq: Once | ORAL | Status: AC
  Administered 2011-08-11: 30 mL via ORAL
  Filled 2011-08-11: qty 15

## 2011-08-11 MED ORDER — BUPIVACAINE IN DEXTROSE 0.75-8.25 % IT SOLN
INTRATHECAL | Status: DC | PRN
  Administered 2011-08-11: 1.8 mL via INTRATHECAL

## 2011-08-11 MED ORDER — DEXTROSE IN LACTATED RINGERS 5 % IV SOLN: INTRAVENOUS | Status: DC

## 2011-08-11 MED ORDER — ONDANSETRON HCL 4 MG/2ML IJ SOLN: 4.0000 mg | INTRAMUSCULAR | Status: DC | PRN

## 2011-08-11 MED ORDER — ONDANSETRON HCL 4 MG PO TABS
4.0000 mg | ORAL_TABLET | ORAL | Status: DC | PRN
  Administered 2011-08-11: 4 mg via ORAL
  Filled 2011-08-11: qty 1

## 2011-08-11 MED ORDER — ONDANSETRON HCL 4 MG/2ML IJ SOLN
INTRAMUSCULAR | Status: DC | PRN
  Administered 2011-08-11: 4 mg via INTRAVENOUS

## 2011-08-11 MED ORDER — SCOPOLAMINE 1 MG/3DAYS TD PT72
1.0000 | MEDICATED_PATCH | Freq: Once | TRANSDERMAL | Status: DC
  Administered 2011-08-11: 1.5 mg via TRANSDERMAL
  Filled 2011-08-11: qty 1

## 2011-08-11 MED ORDER — MEPERIDINE HCL 25 MG/ML IJ SOLN: 6.2500 mg | INTRAMUSCULAR | Status: DC | PRN

## 2011-08-11 MED ORDER — OXYTOCIN 20 UNITS IN LACTATED RINGERS INFUSION - SIMPLE: 125.0000 mL/h | INTRAVENOUS | Status: AC

## 2011-08-11 MED ORDER — OXYCODONE-ACETAMINOPHEN 5-325 MG PO TABS
1.0000 | ORAL_TABLET | ORAL | Status: DC | PRN
  Administered 2011-08-12 – 2011-08-14 (×7): 1 via ORAL
  Filled 2011-08-11 (×8): qty 1

## 2011-08-11 MED ORDER — FENTANYL CITRATE 0.05 MG/ML IJ SOLN: 25.0000 ug | INTRAMUSCULAR | Status: DC | PRN

## 2011-08-11 MED ORDER — DIPHENHYDRAMINE HCL 25 MG PO CAPS: 25.0000 mg | ORAL_CAPSULE | Freq: Four times a day (QID) | ORAL | Status: DC | PRN

## 2011-08-11 MED ORDER — OXYTOCIN 10 UNIT/ML IJ SOLN
20.0000 [IU] | INTRAVENOUS | Status: DC | PRN
  Administered 2011-08-11: 20 [IU] via INTRAVENOUS

## 2011-08-11 SURGICAL SUPPLY — 27 items
CHLORAPREP W/TINT 26ML (MISCELLANEOUS) ×3 IMPLANT
CLOTH BEACON ORANGE TIMEOUT ST (SAFETY) ×3 IMPLANT
DERMABOND ADVANCED (GAUZE/BANDAGES/DRESSINGS) ×1
DERMABOND ADVANCED .7 DNX12 (GAUZE/BANDAGES/DRESSINGS) ×2 IMPLANT
ELECT REM PT RETURN 9FT ADLT (ELECTROSURGICAL) ×3
ELECTRODE REM PT RTRN 9FT ADLT (ELECTROSURGICAL) ×2 IMPLANT
EXTRACTOR VACUUM M CUP 4 TUBE (SUCTIONS) IMPLANT
GLOVE BIO SURGEON STRL SZ 6.5 (GLOVE) ×3 IMPLANT
GLOVE BIOGEL PI IND STRL 7.0 (GLOVE) ×4 IMPLANT
GLOVE BIOGEL PI INDICATOR 7.0 (GLOVE) ×2
GOWN PREVENTION PLUS LG XLONG (DISPOSABLE) ×9 IMPLANT
GOWN STRL REIN XL XLG (GOWN DISPOSABLE) ×3 IMPLANT
KIT ABG SYR 3ML LUER SLIP (SYRINGE) ×3 IMPLANT
NEEDLE HYPO 25X5/8 SAFETYGLIDE (NEEDLE) ×3 IMPLANT
NS IRRIG 1000ML POUR BTL (IV SOLUTION) ×3 IMPLANT
PACK C SECTION WH (CUSTOM PROCEDURE TRAY) ×3 IMPLANT
SLEEVE SCD COMPRESS KNEE MED (MISCELLANEOUS) ×3 IMPLANT
STAPLER VISISTAT 35W (STAPLE) IMPLANT
SUT CHROMIC 0 CT 802H (SUTURE) IMPLANT
SUT CHROMIC 0 CTX 36 (SUTURE) ×9 IMPLANT
SUT MON AB-0 CT1 36 (SUTURE) ×3 IMPLANT
SUT PDS AB 0 CTX 60 (SUTURE) ×3 IMPLANT
SUT PLAIN 0 NONE (SUTURE) ×3 IMPLANT
SUT VIC AB 4-0 KS 27 (SUTURE) ×3 IMPLANT
TOWEL OR 17X24 6PK STRL BLUE (TOWEL DISPOSABLE) ×6 IMPLANT
TRAY FOLEY CATH 14FR (SET/KITS/TRAYS/PACK) IMPLANT
WATER STERILE IRR 1000ML POUR (IV SOLUTION) IMPLANT

## 2011-08-11 NOTE — MAU Note (Signed)
Noted gush of fluid around 0830. Called MD office, was advised to come to MAU. C/O some contractions. For repeat C/S.

## 2011-08-11 NOTE — Addendum Note (Signed)
Addendum  created 08/11/11 1634 by Algis Greenhouse, CRNA   Modules edited:Notes Section

## 2011-08-11 NOTE — Consult Note (Signed)
Neonatology Note:   Attendance at C-section:    I was asked to attend this repeat C/S at term. The mother is a G3P2 B pos, GBS unknown with SROM this morning and history of previous C/S. ROM 4 hours prior to delivery, fluid meconium-stained. Infant vigorous with good spontaneous cry and tone. Needed bulb suctioning and spat up large amounts of green fluid twice, so we DeLee suctioned to the stomach and got another 4 ml dark green fluid. Ap 9/9. Lungs clear to ausc in DR. To CN to care of Pediatrician.   Deatra James, MD

## 2011-08-11 NOTE — Anesthesia Postprocedure Evaluation (Signed)
  Anesthesia Post-op Note  Patient: Teacher, English as a foreign language  Procedure(s) Performed: Procedure(s) (LRB): CESAREAN SECTION WITH BILATERAL TUBAL LIGATION (Bilateral)  Patient Location: PACU  Anesthesia Type: Spinal  Level of Consciousness: awake, alert  and oriented  Airway and Oxygen Therapy: Patient Spontanous Breathing  Post-op Pain: none  Post-op Assessment: Post-op Vital signs reviewed, Patient's Cardiovascular Status Stable, Respiratory Function Stable, Patent Airway, No signs of Nausea or vomiting, Pain level controlled, No headache and No backache  Post-op Vital Signs: Reviewed and stable  Complications: No apparent anesthesia complications

## 2011-08-11 NOTE — Anesthesia Procedure Notes (Signed)
Spinal  Patient location during procedure: OR Start time: 08/11/2011 12:41 PM Staffing Anesthesiologist: Kean Gautreau A. Performed by: anesthesiologist  Preanesthetic Checklist Completed: patient identified, site marked, surgical consent, pre-op evaluation, timeout performed, IV checked, risks and benefits discussed and monitors and equipment checked Spinal Block Patient position: sitting Prep: DuraPrep Patient monitoring: heart rate, cardiac monitor, continuous pulse ox and blood pressure Approach: midline Location: L3-4 Injection technique: single-shot Needle Needle type: Sprotte  Needle gauge: 24 G Needle length: 9 cm Assessment Sensory level: T4 Additional Notes Patient tolerated procedure well. Adequate sensory level.

## 2011-08-11 NOTE — Op Note (Addendum)
Cesarean Section Procedure Note   Shawna Morris  08/11/2011  Indications: SROM, desires sterilization   Pre-operative Diagnosis: SROM, desires sterilization  Post-operative Diagnosis: Same   Surgeon: Surgeon(s) and Role:    * Zelphia Cairo, MD - Primary   Assistants: none  Anesthesia: spinal   Procedure:  Repeat LTCD, BPS  Procedure Details:  The patient was seen in the Holding Room. The risks, benefits, complications, treatment options, and expected outcomes were discussed with the patient. The patient concurred with the proposed plan, giving informed consent. identified as Shawna Morris and the procedure verified as C-Section Delivery. A Time Out was held and the above information confirmed.  After induction of anesthesia, the patient was draped and prepped in the usual sterile manner. A transverse was made and carried down through the subcutaneous tissue to the fascia. Fascial incision was made and extended transversely. The fascia was separated from the underlying rectus tissue superiorly and inferiorly. The peritoneum was identified and entered. Peritoneal incision was extended longitudinally. The utero-vesical peritoneal reflection was incised transversely and the bladder flap was bluntly freed from the lower uterine segment. A low transverse uterine incision was made. Delivered from cephalic presentation was a viable infant with Apgar scores of 9 at one minute and 9 at five minutes. Cord ph was not  sent the umbilical cord was clamped and cut cord blood was obtained for evaluation. The placenta was removed Intact and appeared normal. The uterine outline, tubes and ovaries appeared normal}. The uterine incision was closed with running locked sutures of 0chromic gut.   Hemostasis was observed. Lavage was carried out until clear.  Right fallopian tube grasped with babcock and a knuckle of the isthmic portion was doubly tied with plain gut suture.  Portion of tube was excised and  passed off to path.  Procedure was repeated on left fallopian tube.  Hemostasis observed.  The fascia was then reapproximated with running sutures of 0PDS. The skin was closed with 4-0Vicryl.   Instrument, sponge, and needle counts were correct prior the abdominal closure and were correct at the conclusion of the case.     Estimated Blood Loss: 700cc  Urine Output: clear  Specimens: placenta, segments of bilateral fallopian tubes  Complications: no complications  Disposition: PACU - hemodynamically stable.   Maternal Condition: stable   Baby condition / location:  nursery-stable  Attending Attestation: I was present and scrubbed for the entire procedure.   Signed: Surgeon(s): Zelphia Cairo, MD

## 2011-08-11 NOTE — MAU Note (Signed)
Central nursery notified of C/S going from MAU. No time posted.

## 2011-08-11 NOTE — Anesthesia Postprocedure Evaluation (Signed)
  Anesthesia Post Note  Patient: Teacher, English as a foreign language  Procedure(s) Performed: Procedure(s) (LRB): CESAREAN SECTION WITH BILATERAL TUBAL LIGATION (Bilateral)  Anesthesia type: Spinal  Patient location: Mother/Baby  Post pain: Pain level controlled  Post assessment: Post-op Vital signs reviewed  Last Vitals:  Filed Vitals:   08/11/11 1524  BP: 114/64  Pulse: 75  Temp: 36.4 C  Resp: 20    Post vital signs: Reviewed  Level of consciousness: awake  Complications: No apparent anesthesia complications

## 2011-08-11 NOTE — Transfer of Care (Signed)
Immediate Anesthesia Transfer of Care Note  Patient: Shawna Morris  Procedure(s) Performed: Procedure(s) (LRB): CESAREAN SECTION WITH BILATERAL TUBAL LIGATION (Bilateral)  Patient Location: PACU  Anesthesia Type: Spinal  Level of Consciousness: awake, alert  and oriented  Airway & Oxygen Therapy: Patient Spontanous Breathing  Post-op Assessment: Report given to PACU RN and Post -op Vital signs reviewed and stable  Post vital signs: Reviewed and stable  Complications: No apparent anesthesia complications

## 2011-08-11 NOTE — MAU Note (Signed)
Pt states had lof at 0810, noted brown blood. For repeat c/s and tubal ligation.

## 2011-08-11 NOTE — Anesthesia Preprocedure Evaluation (Signed)
Anesthesia Evaluation  Patient identified by MRN, date of birth, ID band Patient awake    Reviewed: Allergy & Precautions, H&P , Patient's Chart, lab work & pertinent test results  Airway Mallampati: II TM Distance: >3 FB Neck ROM: Full    Dental No notable dental hx. (+) Teeth Intact   Pulmonary neg pulmonary ROS,  breath sounds clear to auscultation  Pulmonary exam normal       Cardiovascular negative cardio ROS  Rhythm:Regular Rate:Normal     Neuro/Psych negative neurological ROS  negative psych ROS   GI/Hepatic negative GI ROS, Neg liver ROS,   Endo/Other  negative endocrine ROS  Renal/GU negative Renal ROS  negative genitourinary   Musculoskeletal   Abdominal Normal abdominal exam  (+)   Peds  Hematology negative hematology ROS (+)   Anesthesia Other Findings   Reproductive/Obstetrics (+) Pregnancy                           Anesthesia Physical Anesthesia Plan  ASA: II and Emergent  Anesthesia Plan: Spinal   Post-op Pain Management:    Induction:   Airway Management Planned:   Additional Equipment:   Intra-op Plan:   Post-operative Plan:   Informed Consent: I have reviewed the patients History and Physical, chart, labs and discussed the procedure including the risks, benefits and alternatives for the proposed anesthesia with the patient or authorized representative who has indicated his/her understanding and acceptance.     Plan Discussed with: Anesthesiologist, CRNA and Surgeon  Anesthesia Plan Comments:         Anesthesia Quick Evaluation  

## 2011-08-11 NOTE — Progress Notes (Signed)
Pt w/ SROM this morning.  Will procede with repeat c-section and BTL.   R/B/A discussed and informed consent.

## 2011-08-11 NOTE — MAU Provider Note (Cosign Needed)
Performed sterile speculum examination on patient to check for pooling and fern slide collection per request of RN.  Clear fluid with some blood present noted.  Swab obtained for fern slide.  Fern slide positive per Charity fundraiser.  Bimanual exam: Cervix soft, fingertip and 60% Chaperone present for exam.  Servando Salina RN, FNP student

## 2011-08-12 ENCOUNTER — Encounter (HOSPITAL_COMMUNITY): Admission: RE | Payer: Self-pay | Source: Ambulatory Visit

## 2011-08-12 ENCOUNTER — Inpatient Hospital Stay (HOSPITAL_COMMUNITY)
Admission: RE | Admit: 2011-08-12 | Payer: BC Managed Care – PPO | Source: Ambulatory Visit | Admitting: Obstetrics and Gynecology

## 2011-08-12 LAB — CBC
HCT: 29.6 % — ABNORMAL LOW (ref 36.0–46.0)
Hemoglobin: 9.4 g/dL — ABNORMAL LOW (ref 12.0–15.0)
MCH: 26.3 pg (ref 26.0–34.0)
MCHC: 31.8 g/dL (ref 30.0–36.0)

## 2011-08-12 SURGERY — Surgical Case
Anesthesia: Regional

## 2011-08-12 NOTE — Progress Notes (Signed)
Subjective: Postpartum Day one: Cesarean Delivery Patient reports incisional pain and tolerating PO.    Objective: Vital signs in last 24 hours: Temp:  [97.4 F (36.3 C)-98.5 F (36.9 C)] 98.3 F (36.8 C) (06/06 0500) Pulse Rate:  [75-93] 83  (06/06 0500) Resp:  [16-26] 18  (06/06 0500) BP: (100-122)/(54-83) 106/67 mmHg (06/06 0500) SpO2:  [97 %-100 %] 98 % (06/06 0500) Weight:  [86.183 kg (190 lb)] 86.183 kg (190 lb) (06/05 1346)  Physical Exam:  General: alert Lochia: appropriate Uterine Fundus: firm Incision: healing well DVT Evaluation: No evidence of DVT seen on physical exam.   Basename 08/12/11 0535 08/11/11 1135  HGB 9.4* 10.1*  HCT 29.6* 31.9*    Assessment/Plan: Status post Cesarean section. Doing well postoperatively.  Continue current care.  Sonu Kruckenberg S 08/12/2011, 9:40 AM

## 2011-08-13 NOTE — Progress Notes (Signed)
Subjective: Postpartum Day 2: Cesarean Delivery Patient reports tolerating PO.    Objective: Vital signs in last 24 hours: Temp:  [97.7 F (36.5 C)-100.5 F (38.1 C)] 97.7 F (36.5 C) (06/07 0639) Pulse Rate:  [84-96] 86  (06/07 0639) Resp:  [18-20] 18  (06/07 0639) BP: (96-131)/(54-72) 113/61 mmHg (06/07 0639) SpO2:  [97 %-99 %] 99 % (06/06 1315)  Physical Exam:  General: alert, cooperative and no distress Lochia: appropriate Uterine Fundus: firm Incision: healing well DVT Evaluation: No evidence of DVT seen on physical exam.   Basename 08/12/11 0535 08/11/11 1135  HGB 9.4* 10.1*  HCT 29.6* 31.9*    Assessment/Plan: Status post Cesarean section. Doing well postoperatively.  Continue current care.  Yisrael Obryan II,Uday Jantz E 08/13/2011, 8:52 AM

## 2011-08-14 MED ORDER — OXYCODONE-ACETAMINOPHEN 5-325 MG PO TABS: 1.0000 | ORAL_TABLET | Freq: Four times a day (QID) | ORAL | Status: AC | PRN

## 2011-08-14 MED ORDER — OXYCODONE-ACETAMINOPHEN 5-325 MG PO TABS: 1.0000 | ORAL_TABLET | ORAL | Status: AC | PRN

## 2011-08-14 NOTE — Progress Notes (Signed)
Subjective: Postpartum Day 3 Cesarean Delivery Patient reports tolerating PO, + flatus and no problems voiding.    Objective: Vital signs in last 24 hours: Temp:  [98 F (36.7 C)-98.8 F (37.1 C)] 98.1 F (36.7 C) (06/08 1610) Pulse Rate:  [80-83] 80  (06/08 0613) Resp:  [18-19] 18  (06/08 0613) BP: (93-117)/(60-70) 109/70 mmHg (06/08 9604)  Physical Exam:  General: alert, cooperative and no distress Lochia: appropriate Uterine Fundus: firm Incision: healing well DVT Evaluation: No evidence of DVT seen on physical exam.   Basename 08/12/11 0535 08/11/11 1135  HGB 9.4* 10.1*  HCT 29.6* 31.9*    Assessment/Plan: Status post Cesarean section. Doing well postoperatively.  Discharge home with standard precautions and return to clinic in 4-6 weeks.  Jeramy Dimmick II,Teegan Guinther E 08/14/2011, 7:35 AM

## 2011-08-14 NOTE — Discharge Summary (Signed)
Obstetric Discharge Summary Reason for Admission: rupture of membranes Prenatal Procedures: ultrasound Intrapartum Procedures: cesarean: low cervical, transverse Postpartum Procedures: none Complications-Operative and Postpartum: none Hemoglobin  Date Value Range Status  08/12/2011 9.4* 12.0-15.0 (g/dL) Final     HCT  Date Value Range Status  08/12/2011 29.6* 36.0-46.0 (%) Final    Physical Exam:  General: alert, cooperative and no distress Lochia: appropriate Uterine Fundus: firm Incision: healing well DVT Evaluation: No evidence of DVT seen on physical exam.  Discharge Diagnoses: Term Pregnancy-delivered  Discharge Information: Date: 08/14/2011 Activity: pelvic rest Diet: routine Medications: PNV and Percocet Condition: stable Instructions: refer to practice specific booklet Discharge to: home Follow-up Information    Follow up in 2 weeks.         Newborn Data: Live born female  Birth Weight: 6 lb 7.9 oz (2945 g) APGAR: 9, 9  Home with mother.  Macee Venables II,Verenis Nicosia E 08/14/2011, 7:37 AM

## 2014-01-07 ENCOUNTER — Encounter (HOSPITAL_COMMUNITY): Payer: Self-pay | Admitting: *Deleted

## 2014-11-28 ENCOUNTER — Other Ambulatory Visit: Payer: Self-pay | Admitting: Obstetrics and Gynecology

## 2014-11-28 DIAGNOSIS — N63 Unspecified lump in unspecified breast: Secondary | ICD-10-CM

## 2014-12-11 ENCOUNTER — Other Ambulatory Visit: Payer: Managed Care, Other (non HMO)

## 2014-12-13 ENCOUNTER — Ambulatory Visit
Admission: RE | Admit: 2014-12-13 | Discharge: 2014-12-13 | Disposition: A | Discharge status: Home / Self Care | Payer: 59 | Source: Ambulatory Visit | Attending: Obstetrics and Gynecology | Admitting: Obstetrics and Gynecology

## 2014-12-13 DIAGNOSIS — N63 Unspecified lump in unspecified breast: Secondary | ICD-10-CM

## 2015-04-17 ENCOUNTER — Encounter (HOSPITAL_COMMUNITY): Payer: Self-pay

## 2015-04-17 ENCOUNTER — Emergency Department (HOSPITAL_COMMUNITY)
Admission: EM | Admit: 2015-04-17 | Discharge: 2015-04-17 | Disposition: A | Discharge status: Home / Self Care | Payer: 59 | Attending: Physician Assistant | Admitting: Physician Assistant

## 2015-04-17 ENCOUNTER — Emergency Department (HOSPITAL_COMMUNITY): Payer: 59

## 2015-04-17 DIAGNOSIS — Z3202 Encounter for pregnancy test, result negative: Secondary | ICD-10-CM | POA: Insufficient documentation

## 2015-04-17 DIAGNOSIS — Z79899 Other long term (current) drug therapy: Secondary | ICD-10-CM | POA: Insufficient documentation

## 2015-04-17 DIAGNOSIS — M545 Low back pain: Secondary | ICD-10-CM | POA: Diagnosis present

## 2015-04-17 DIAGNOSIS — N838 Other noninflammatory disorders of ovary, fallopian tube and broad ligament: Secondary | ICD-10-CM | POA: Diagnosis not present

## 2015-04-17 DIAGNOSIS — N83209 Unspecified ovarian cyst, unspecified side: Secondary | ICD-10-CM

## 2015-04-17 LAB — CBC WITH DIFFERENTIAL/PLATELET
BASOS ABS: 0 10*3/uL (ref 0.0–0.1)
Basophils Relative: 0 %
Eosinophils Absolute: 0.1 10*3/uL (ref 0.0–0.7)
Eosinophils Relative: 1 %
HCT: 38.2 % (ref 36.0–46.0)
HEMOGLOBIN: 12.3 g/dL (ref 12.0–15.0)
LYMPHS PCT: 46 %
Lymphs Abs: 2.3 10*3/uL (ref 0.7–4.0)
MCH: 26.2 pg (ref 26.0–34.0)
MCHC: 32.2 g/dL (ref 30.0–36.0)
MCV: 81.4 fL (ref 78.0–100.0)
MONO ABS: 0.2 10*3/uL (ref 0.1–1.0)
MONOS PCT: 4 %
NEUTROS ABS: 2.4 10*3/uL (ref 1.7–7.7)
NEUTROS PCT: 49 %
Platelets: 218 10*3/uL (ref 150–400)
RBC: 4.69 MIL/uL (ref 3.87–5.11)
RDW: 14.3 % (ref 11.5–15.5)
WBC: 4.9 10*3/uL (ref 4.0–10.5)

## 2015-04-17 LAB — BASIC METABOLIC PANEL
ANION GAP: 5 (ref 5–15)
BUN: 6 mg/dL (ref 6–20)
CHLORIDE: 107 mmol/L (ref 101–111)
CO2: 27 mmol/L (ref 22–32)
Calcium: 9.1 mg/dL (ref 8.9–10.3)
Creatinine, Ser: 0.86 mg/dL (ref 0.44–1.00)
GFR calc Af Amer: >60 mL/min (ref >60)
GFR calc non Af Amer: >60 mL/min (ref >60)
Glucose, Bld: 96 mg/dL (ref 65–99)
POTASSIUM: 4 mmol/L (ref 3.5–5.1)
SODIUM: 139 mmol/L (ref 135–145)

## 2015-04-17 LAB — URINALYSIS, ROUTINE W REFLEX MICROSCOPIC
Bilirubin Urine: NEGATIVE
Glucose, UA: NEGATIVE mg/dL
HGB URINE DIPSTICK: NEGATIVE
Ketones, ur: NEGATIVE mg/dL
LEUKOCYTES UA: NEGATIVE
Nitrite: NEGATIVE
Protein, ur: NEGATIVE mg/dL
SPECIFIC GRAVITY, URINE: 1.026 (ref 1.005–1.030)
pH: 5.5 (ref 5.0–8.0)

## 2015-04-17 LAB — POC URINE PREG, ED: PREG TEST UR: NEGATIVE

## 2015-04-17 MED ORDER — HYDROCODONE-ACETAMINOPHEN 5-325 MG PO TABS
1.0000 | ORAL_TABLET | Freq: Once | ORAL | Status: AC
  Administered 2015-04-17: 1 via ORAL
  Filled 2015-04-17: qty 1

## 2015-04-17 MED ORDER — HYDROCODONE-ACETAMINOPHEN 5-325 MG PO TABS: 1.0000 | ORAL_TABLET | Freq: Four times a day (QID) | ORAL | Status: DC | PRN

## 2015-04-17 NOTE — ED Provider Notes (Signed)
CSN: [id number]     Arrival date & time 04/17/15  1028 History  By signing my name below, I, Lyndel Safe, attest that this documentation has been prepared under the direction and in the presence of [employer], PA-C. Electronically Signed: Lyndel Safe, ED Scribe. 04/17/2015. 1:50 PM.   Chief Complaint  Patient presents with  . Back Pain   The history is provided by the patient. No language interpreter was used.   HPI Comments: Shawna Morris is a 40 y.o. female, with no pertinent PMhx, who presents to the Emergency Department complaining of sudden onset, constant, moderate bilateral flank pain radiating intermittently to bilateral lower quadrants onset 1 day ago without injury or trauma. She notes experiencing dysuria 3 days ago that has since resolved. The pt was seen yesterday at an [facility] for the same complaint when she was prescribed a laxative which the pt states has provided no relief. The pt notes improvement of pain with Norco given in ED. She has a PShx of Cesarean section. LNMP 03/23/15. Pt denies fevers, nausea, vomiting, diarrhea, constipation, new or worsening vaginal discharge or vaginal bleeding. No history of nephrolithiasis.   Past Medical History  Diagnosis Date  . No pertinent past medical history   . Complication of anesthesia     states "last epidural did not work" wants to be "asleep"   Past Surgical History  Procedure Laterality Date  . Cesarean section     Family History  Problem Relation Age of Onset  . Anesthesia problems Neg Hx    Social History  Substance Use Topics  . Smoking status: Never Smoker   . Smokeless tobacco: Never Used  . Alcohol Use: No   OB History    Gravida Para Term Preterm AB TAB SAB Ectopic Multiple Living   0 0 0 0 0 1 4     Review of Systems  Constitutional: Negative for fever.  Gastrointestinal: Positive for abdominal pain (RLQ, LLQ). Negative for nausea, vomiting, diarrhea and constipation.  Genitourinary:  Positive for flank pain ( B/L). Negative for vaginal bleeding and vaginal discharge.   Allergies  Aspirin  Home Medications   Prior to Admission medications   Medication Sig Start Date End Date Taking? Authorizing Provider  IRON PO Take 1 tablet by mouth daily.    Historical Provider, MD  prenatal vitamin w/FE, FA (PRENATAL 1 + 1) 27-1 MG TABS Take 1 tablet by mouth daily.      Historical Provider, MD   BP 111/66 mmHg  Pulse 80  Temp(Src) 97.4 F (36.3 C) (Oral)  SpO2 100%  LMP 03/23/2015 Physical Exam  Constitutional: She is oriented to person, place, and time. She appears well-developed and well-nourished. No distress.  HENT:  Head: Normocephalic.  Eyes: Conjunctivae are normal.  Neck: Normal range of motion. Neck supple.  Cardiovascular: Normal rate, regular rhythm and normal heart sounds.   Pulmonary/Chest: Effort normal and breath sounds normal. No respiratory distress. She has no wheezes.  Abdominal: Soft. Bowel sounds are normal. She exhibits no distension and no mass. There is tenderness in the right lower quadrant, suprapubic area and left lower quadrant. There is CVA tenderness ( B/L). There is no rigidity, no rebound and no guarding.  Bilateral CVA tenderness.   Genitourinary:  Patient declined pelvic exam  Musculoskeletal: Normal range of motion.  Neurological: She is alert and oriented to person, place, and time. Coordination normal.  Skin: Skin is warm.  Psychiatric: She has a normal mood and affect.  Her behavior is normal.  Nursing note and vitals reviewed.   ED Course  Procedures  DIAGNOSTIC STUDIES: Oxygen Saturation is 100% on RA, normal by my interpretation.    COORDINATION OF CARE: 1:46 PM Discussed treatment plan which includes to order CT renal stone study with pt. Labs thus far unremarkable. Pt acknowledges and agrees to plan.   Labs Review Labs Reviewed  URINALYSIS, ROUTINE W REFLEX MICROSCOPIC (NOT AT [facility]) - Abnormal; Notable for the following:     APPearance CLOUDY (*)    All other components within normal limits  CBC WITH DIFFERENTIAL/PLATELET  BASIC METABOLIC PANEL  POC URINE PREG, ED    Imaging Review Ct Renal Stone Study  04/17/2015  CLINICAL DATA:  Left lower quadrant and flank pain for 1 month, progressed recently EXAM: CT ABDOMEN AND PELVIS WITHOUT CONTRAST TECHNIQUE: Multidetector CT imaging of the abdomen and pelvis was performed following the standard protocol without oral or intravenous contrast material administration. COMPARISON:  None. FINDINGS: Lower chest:  Lung bases are clear. Hepatobiliary: No focal liver lesions are identified on this noncontrast enhanced study. The gallbladder wall does not appear appreciably thickened. There is no biliary duct dilatation. Pancreas: No pancreatic mass or inflammatory focus. Spleen: No splenic lesions are identified. Adrenals/Urinary Tract: Adrenals appear normal bilaterally. Kidneys bilaterally show no mass or hydronephrosis on either side. There is no renal or ureteral calculus on either side. The urinary bladder is midline with wall thickness within normal limits. There are several phleboliths in the pelvis which are felt to be separate from the distal left ureter. Stomach/Bowel: There is no bowel wall or mesenteric thickening. There is no bowel obstruction. No free air or portal venous air. Vascular/Lymphatic: There is no abdominal aortic aneurysm. No vascular lesions are identified on this noncontrast enhanced study. There is no demonstrable adenopathy in the abdomen or pelvis. Reproductive: The uterus is anteverted. There is no pelvic mass. There is moderate free fluid in the cul-de-sac region. Other: The appendix appears normal. There is no abscess in the abdomen or pelvis. There is a small ventral hernia. A small portion of transverse colon protrudes into this ventral hernia without bowel compromise. Musculoskeletal: There are no blastic or lytic bone lesions. No intramuscular or  abdominal wall lesion. IMPRESSION: Moderate free fluid in the cul-de-sac. Suspect recent ovarian cyst rupture. No demonstrable renal or ureteral calculus.  No hydronephrosis. Appendix appears normal.  No bowel obstruction.  No abscess. Ventral hernia containing a small amount of large bowel but no bowel compromise. Electronically Signed   By: Bretta Bang III M.D.   On: 04/17/2015 15:09   I have personally reviewed and evaluated these images and lab results as part of my medical decision-making.   MDM   Final diagnoses:  None  Patient presents today with bilateral flank pain radiating to the bilateral lower abdomen.  Patient declined pelvic exam.  UA is negative.  Urine pregnancy negative.  Labs unremarkable.  CT renal stone study as above showing possible ovarian cyst rupture.  Feel that the patient is stable for discharge.  Patient instructed to follow up with her Gynecologist.  Return precautions given.   I personally performed the services described in this documentation, which was scribed in my presence. The recorded information has been reviewed and is accurate.    Santiago Glad, PA-C 04/17/15 1752  Courteney Randall An, MD 04/18/15 [phone]

## 2015-04-17 NOTE — ED Notes (Signed)
Patient here with chronic back pain that worsened yesterday, denies new injury

## 2015-04-17 NOTE — ED Provider Notes (Signed)
MSE was initiated and I personally evaluated the patient and placed orders (if any) at  11:50 AM on April 17, 2015.  The patient appears stable so that the remainder of the MSE may be completed by another provider.  Blood pressure 111/66, pulse 80, temperature 97.4 F (36.3 C), temperature source Oral, last menstrual period 03/23/2015, SpO2 100 %, unknown if currently breastfeeding.  Shawna Morris is a 40 y.o. female complaining of left low back pain 1 week, worsened since yesterday. No known injury. Pt states that 10/10 left flank pain wraps around into her abdomen and is exacerbated with palpation. She reports associated sensation of incomplete void without dysuria or hematuria. Patient denies vaginal discharge, fever, chills, nausea, vomiting. No history of kidney stones.   LSCTA b/l; Heart is RRR; tender to palpation left lower quadrant with no guarding or rebound, no CVA tenderness to percussion bilaterally. MAE, goal oriented speech.   Wynetta Emery, PA-C 04/17/15 1204  Arby Barrette, MD 04/18/15 [phone]

## 2015-06-13 ENCOUNTER — Emergency Department (HOSPITAL_COMMUNITY): Payer: 59

## 2015-06-13 ENCOUNTER — Encounter (HOSPITAL_COMMUNITY): Payer: Self-pay | Admitting: Emergency Medicine

## 2015-06-13 ENCOUNTER — Emergency Department (HOSPITAL_COMMUNITY)
Admission: EM | Admit: 2015-06-13 | Discharge: 2015-06-13 | Disposition: A | Discharge status: Home / Self Care | Payer: 59 | Attending: Emergency Medicine | Admitting: Emergency Medicine

## 2015-06-13 DIAGNOSIS — S40021A Contusion of right upper arm, initial encounter: Secondary | ICD-10-CM | POA: Diagnosis not present

## 2015-06-13 DIAGNOSIS — S199XXA Unspecified injury of neck, initial encounter: Secondary | ICD-10-CM | POA: Diagnosis present

## 2015-06-13 DIAGNOSIS — Y92410 Unspecified street and highway as the place of occurrence of the external cause: Secondary | ICD-10-CM | POA: Diagnosis not present

## 2015-06-13 DIAGNOSIS — Z79899 Other long term (current) drug therapy: Secondary | ICD-10-CM | POA: Insufficient documentation

## 2015-06-13 DIAGNOSIS — S161XXA Strain of muscle, fascia and tendon at neck level, initial encounter: Secondary | ICD-10-CM | POA: Insufficient documentation

## 2015-06-13 DIAGNOSIS — Y9389 Activity, other specified: Secondary | ICD-10-CM | POA: Insufficient documentation

## 2015-06-13 DIAGNOSIS — Y998 Other external cause status: Secondary | ICD-10-CM | POA: Insufficient documentation

## 2015-06-13 MED ORDER — TRAMADOL HCL 50 MG PO TABS: 50.0000 mg | ORAL_TABLET | Freq: Four times a day (QID) | ORAL | Status: DC | PRN

## 2015-06-13 MED ORDER — IBUPROFEN 800 MG PO TABS: 800.0000 mg | ORAL_TABLET | Freq: Three times a day (TID) | ORAL | Status: DC | PRN
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
## 2015-06-13 NOTE — ED Notes (Signed)
Patient transported to X-ray 
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
## 2015-06-13 NOTE — Discharge Instructions (Signed)
Return here as needed.  Follow-up with the, Dr. provided as needed for any worsening symptoms.  Use ice and heat on the areas that are sore.  He can expect to be more sore tomorrow and over the next 7-14 days

## 2016-09-23 ENCOUNTER — Encounter (HOSPITAL_COMMUNITY): Payer: Self-pay | Admitting: Emergency Medicine

## 2016-09-23 ENCOUNTER — Emergency Department (HOSPITAL_COMMUNITY)
Admission: EM | Admit: 2016-09-23 | Discharge: 2016-09-23 | Disposition: A | Discharge status: Home / Self Care | Payer: 59 | Attending: Emergency Medicine | Admitting: Emergency Medicine

## 2016-09-23 DIAGNOSIS — Z79899 Other long term (current) drug therapy: Secondary | ICD-10-CM | POA: Insufficient documentation

## 2016-09-23 DIAGNOSIS — R509 Fever, unspecified: Secondary | ICD-10-CM | POA: Insufficient documentation

## 2016-09-23 DIAGNOSIS — R1084 Generalized abdominal pain: Secondary | ICD-10-CM | POA: Diagnosis not present

## 2016-09-23 DIAGNOSIS — R110 Nausea: Secondary | ICD-10-CM | POA: Insufficient documentation

## 2016-09-23 LAB — CBC
HCT: 37.9 % (ref 36.0–46.0)
Hemoglobin: 12.3 g/dL (ref 12.0–15.0)
MCH: 26.3 pg (ref 26.0–34.0)
MCHC: 32.5 g/dL (ref 30.0–36.0)
MCV: 81.2 fL (ref 78.0–100.0)
PLATELETS: 170 10*3/uL (ref 150–400)
RBC: 4.67 MIL/uL (ref 3.87–5.11)
RDW: 14.1 % (ref 11.5–15.5)
WBC: 5.5 10*3/uL (ref 4.0–10.5)

## 2016-09-23 LAB — URINALYSIS, ROUTINE W REFLEX MICROSCOPIC
BILIRUBIN URINE: NEGATIVE
Glucose, UA: NEGATIVE mg/dL
Ketones, ur: 20 mg/dL — AB
LEUKOCYTES UA: NEGATIVE
Nitrite: NEGATIVE
PH: 5 (ref 5.0–8.0)
Protein, ur: 30 mg/dL — AB
SPECIFIC GRAVITY, URINE: 1.021 (ref 1.005–1.030)

## 2016-09-23 LAB — COMPREHENSIVE METABOLIC PANEL
ALBUMIN: 3.8 g/dL (ref 3.5–5.0)
ALT: 21 U/L (ref 14–54)
AST: 21 U/L (ref 15–41)
Alkaline Phosphatase: 76 U/L (ref 38–126)
Anion gap: 6 (ref 5–15)
BILIRUBIN TOTAL: 0.7 mg/dL (ref 0.3–1.2)
BUN: 7 mg/dL (ref 6–20)
CHLORIDE: 104 mmol/L (ref 101–111)
CO2: 26 mmol/L (ref 22–32)
CREATININE: 0.89 mg/dL (ref 0.44–1.00)
Calcium: 8.5 mg/dL — ABNORMAL LOW (ref 8.9–10.3)
GFR calc Af Amer: >60 mL/min (ref >60)
GLUCOSE: 88 mg/dL (ref 65–99)
Potassium: 3.4 mmol/L — ABNORMAL LOW (ref 3.5–5.1)
Sodium: 136 mmol/L (ref 135–145)
Total Protein: 7.5 g/dL (ref 6.5–8.1)

## 2016-09-23 LAB — LIPASE, BLOOD: LIPASE: 20 U/L (ref 11–51)

## 2016-09-23 NOTE — ED Notes (Signed)
Pt had drawn in triage: Blue Gold Lavender Lt green  Dark green x2 

## 2016-09-23 NOTE — ED Provider Notes (Signed)
[facility] Provider Note   CSN: [id number] Arrival date & time: 09/23/16  1340     History   Chief Complaint Chief Complaint  Patient presents with  . Nausea  . Fever    HPI Shawna Morris is a 41 y.o. female who presents to the emergency department with abdominal pain 3 days. She reports right-sided, sharp, intermittent abdominal pain with associated nausea and fever. The patient states that her temperature at home last night measured ~105.0, but she reports a subjective fever x3 days. She reports that she was evaluated twice by her primary care, and encouraged to come to the emergency department to get a CT if symptoms did not improve. She also reports intermittent dysuria over the last 2 weeks. She denies emesis, urinary frequency, vaginal pain or itching, constipation, or diarrhea. Surgical history includes C-section. No pertinent past medical history. No sick contacts.  The history is provided by the patient. No language interpreter was used.    Past Medical History:  Diagnosis Date  . Complication of anesthesia    states "last epidural did not work" wants to be "asleep"  . No pertinent past medical history     There are no active problems to display for this patient.   Past Surgical History:  Procedure Laterality Date  . CESAREAN SECTION      OB History    Gravida Para Term Preterm AB Living   3 3 3  0 0 4   SAB TAB Ectopic Multiple Live Births   0 0 0 1 2       Home Medications    Prior to Admission medications   Medication Sig Start Date End Date Taking? Authorizing Provider  acetaminophen (TYLENOL) 500 MG tablet Take 1,000 mg by mouth every 6 (six) hours as needed for mild pain or fever.   Yes [provider]  progesterone (PROMETRIUM) 200 MG capsule Take 200 mg by mouth as directed. Take 200mg  by mouth daily x 12 days each month. 07/20/16  Yes [provider]    Family History Family History  Problem Relation Age of Onset    . Anesthesia problems Neg Hx     Social History Social History  Substance Use Topics  . Smoking status: Never Smoker  . Smokeless tobacco: Never Used  . Alcohol use No     Allergies   Aspirin   Review of Systems Review of Systems  Constitutional: Positive for chills and fever.  Respiratory: Negative for cough and shortness of breath.   Cardiovascular: Negative for chest pain.  Gastrointestinal: Positive for abdominal pain and nausea. Negative for diarrhea and vomiting.  Skin: Negative for rash.  Allergic/Immunologic: Negative for immunocompromised state.   Physical Exam Updated Vital Signs BP 115/72 (BP Location: Left Arm)   Pulse 83   Temp 98.3 F (36.8 C) (Oral)   Resp 18   Ht 5\' 6"  (1.676 m)   Wt 103.4 kg (228 lb)   LMP 09/05/2016   SpO2 98%   BMI 36.80 kg/m   Physical Exam  Constitutional: She appears well-developed and well-nourished. No distress.  HENT:  Head: Normocephalic.  Eyes: Conjunctivae are normal.  Neck: Neck supple.  Cardiovascular: Normal rate, regular rhythm, normal heart sounds and intact distal pulses.  Exam reveals no gallop and no friction rub.   No murmur heard. Pulmonary/Chest: Effort normal. No respiratory distress. She has no wheezes. She has no rales.  Abdominal: Soft. Bowel sounds are normal. She exhibits no distension. There is tenderness. There  is no rebound and no guarding.  Mild suprapubic and RLQ TTP. No rebound or guarding. No CVA tenderness.   Neurological: She is alert.  Skin: Skin is warm. Capillary refill takes less than 2 seconds. No rash noted. She is not diaphoretic.  Psychiatric: Her behavior is normal.  Nursing note and vitals reviewed.  ED Treatments / Results  Labs (all labs ordered are listed, but only abnormal results are displayed) Labs Reviewed  COMPREHENSIVE METABOLIC PANEL - Abnormal; Notable for the following:       Result Value   Potassium 3.4 (*)    Calcium 8.5 (*)    All other components within  normal limits  URINALYSIS, ROUTINE W REFLEX MICROSCOPIC - Abnormal; Notable for the following:    APPearance CLOUDY (*)    Hgb urine dipstick SMALL (*)    Ketones, ur 20 (*)    Protein, ur 30 (*)    Bacteria, UA FEW (*)    Squamous Epithelial / LPF 6-30 (*)    All other components within normal limits  LIPASE, BLOOD  CBC    EKG  EKG Interpretation None       Radiology No results found.  Procedures Procedures (including critical care time)  Medications Ordered in ED Medications - No data to display   Initial Impression / Assessment and Plan / ED Course  I have reviewed the triage vital signs and the nursing notes.  Pertinent labs & imaging results that were available during my care of the patient were reviewed by me and considered in my medical decision making (see chart for details).     Patient is nontoxic, nonseptic appearing, in no apparent distress.  Patient's pain and other symptoms adequately managed in emergency department. Labs and vitals reviewed. K 3.4, but appears consistent with pt's baseline.  UA with small Hgb, but patient report she feels as if her menses is starting. Patient does not meet the SIRS or Sepsis criteria.  On repeat exam patient does not have a surgical abdomen and there are no peritoneal signs.  Patient reports that she was sent from her PCP's office for possible appendicitis, but she is tired of waiting and declines CT at this time. However, no indication of appendicitis, bowel obstruction, bowel perforation, cholecystitis, diverticulitis, PID, or ectopic pregnancy.  Patient successful PO challenge with [employer] and fluids. Patient discharged home and given strict instructions for follow-up with their primary care physician.  I have also discussed reasons to return immediately to the ER.  Patient expresses understanding and agrees with plan.  Final Clinical Impressions(s) / ED Diagnoses   Final diagnoses:  Nausea  Generalized abdominal pain     New Prescriptions Discharge Medication List as of 09/23/2016  7:23 PM       Barkley Boards, PA-C 09/24/16 Sherron Monday, MD 09/25/16 0030

## 2016-09-23 NOTE — ED Notes (Signed)
Discharge instructions reviewed with patient. Patient verbalizes understanding. VSS.   

## 2016-09-23 NOTE — ED Notes (Signed)
Pt aware urine sample is needed 

## 2016-09-23 NOTE — Discharge Instructions (Signed)
Please follow up with your primary care provider is symptoms persist. If you develop new or worsening symptoms, including fever, chills, green or bloody vomiting, please return to the Emergency Department for re-evaluation.

## 2016-09-23 NOTE — ED Notes (Addendum)
Pt from home with c/o sharp right abdominal pain that began Tuesday. Pt states she was seen 2 times by her PCP and told to come here to get CT. No records from her PCP are accessible. Pt denies v/d but states she has intermittent nausea. Pt states she was febrile but is not currently and is not tachycardic

## 2017-02-04 IMAGING — CT CT RENAL STONE PROTOCOL
2 of 4 series · 16 of 46 positions shown, 18 images · non-contrast
Comparison: None.

CLINICAL DATA: Left lower quadrant and flank pain for 1 month,
progressed recently

EXAM:
CT ABDOMEN AND PELVIS WITHOUT CONTRAST
TECHNIQUE: Multidetector CT imaging of the abdomen and pelvis was performed
following the standard protocol without oral or intravenous contrast
material administration.

[Series 2: stone study 5.0 i30f 1 · axial · 0.79mm/px · z∈[+868,+1288]mm · 13 of 92 slices shown, 15 images]
[im 4/92  soft-tissue]
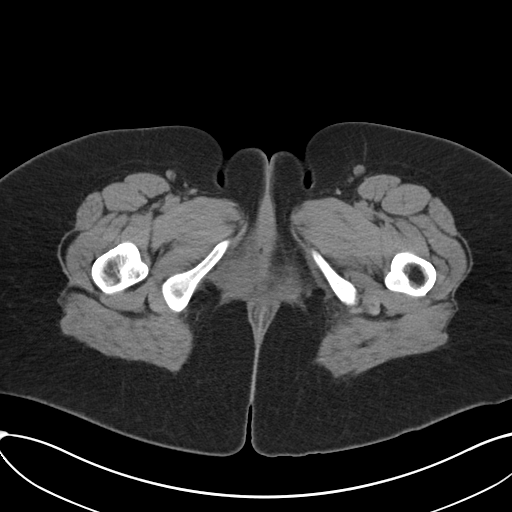
[im 4/92  bone]
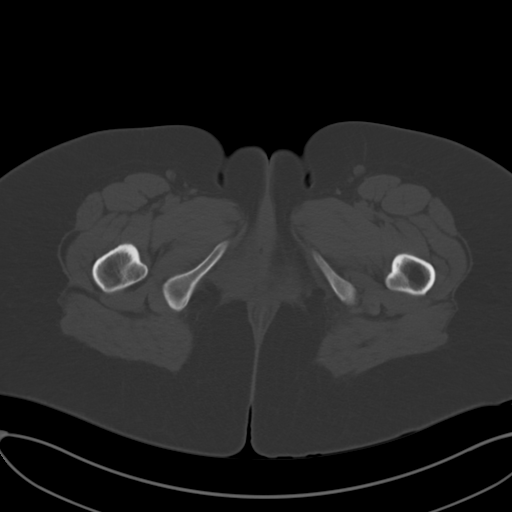
[im 11/92  soft-tissue]
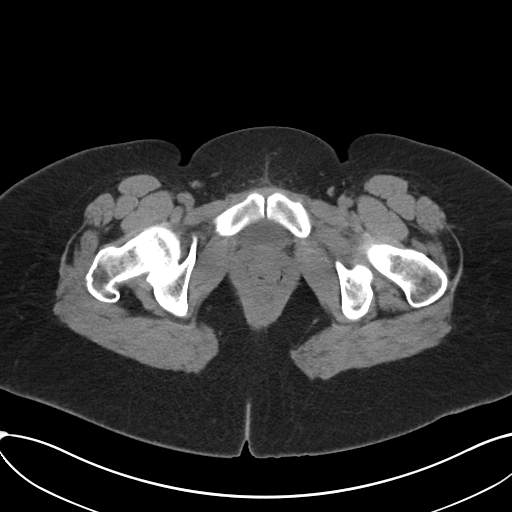
[im 19/92  soft-tissue]
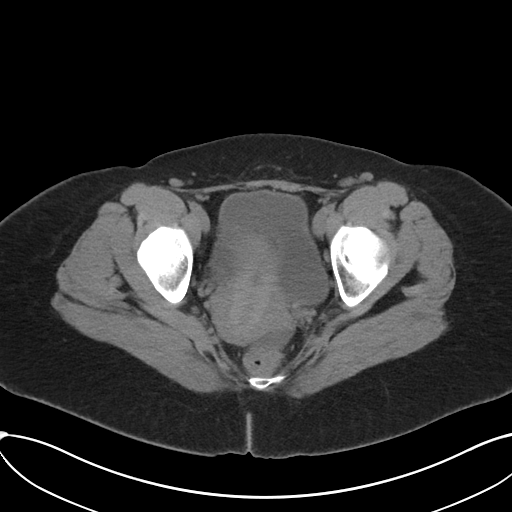
[im 26/92  soft-tissue]
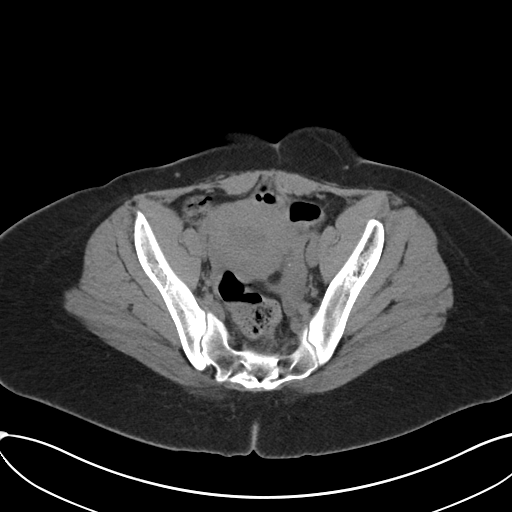
[im 33/92  soft-tissue]
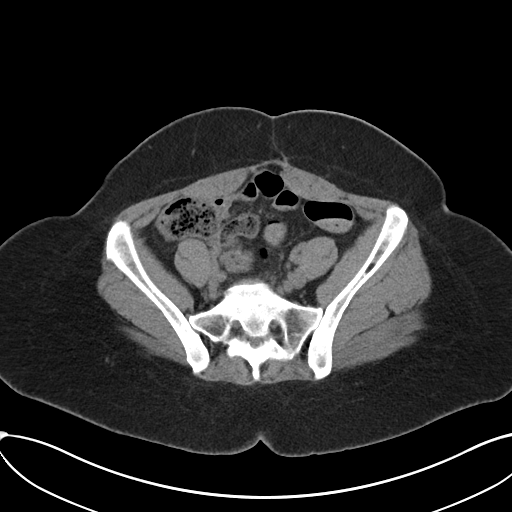
[im 41/92  soft-tissue]
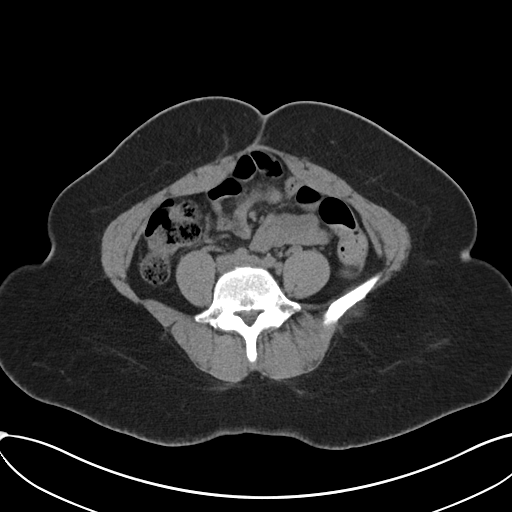
[im 48/92  soft-tissue]
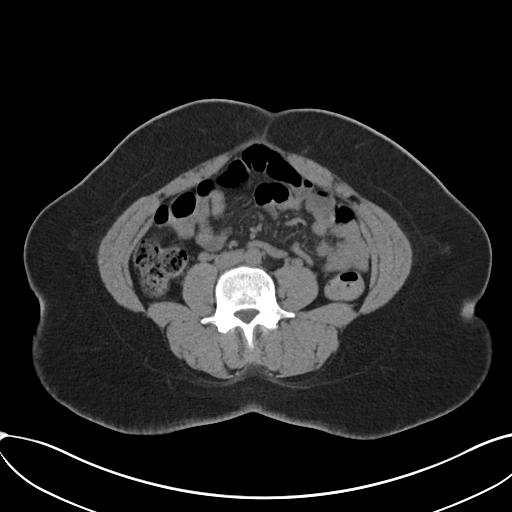
[im 51/92  soft-tissue]
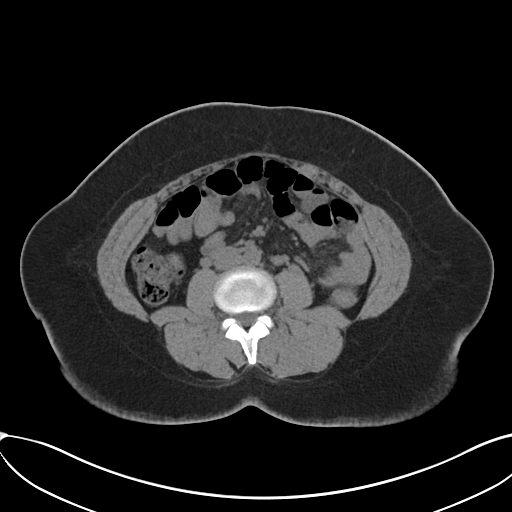
[im 59/92  soft-tissue]
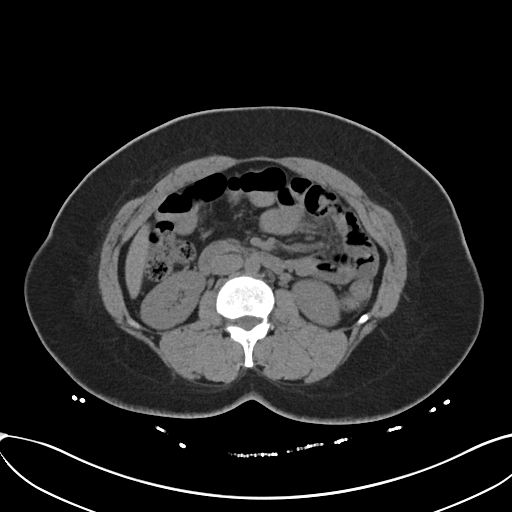
[im 59/92  bone]
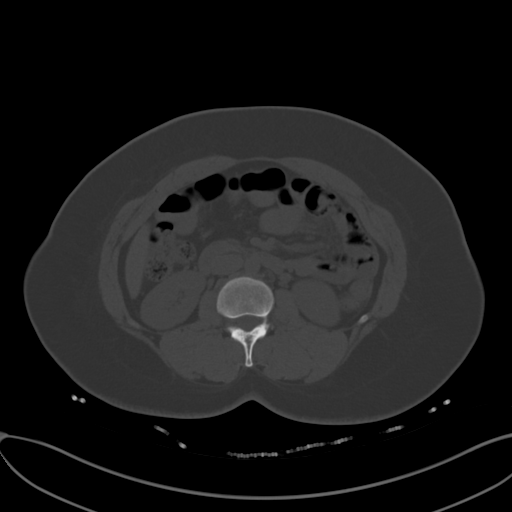
[im 66/92  soft-tissue]
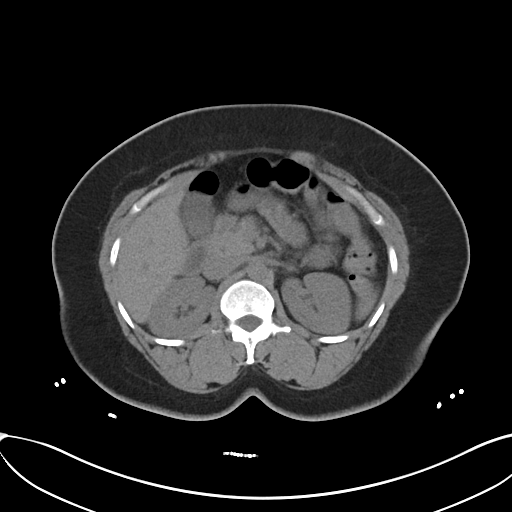
[im 73/92  soft-tissue]
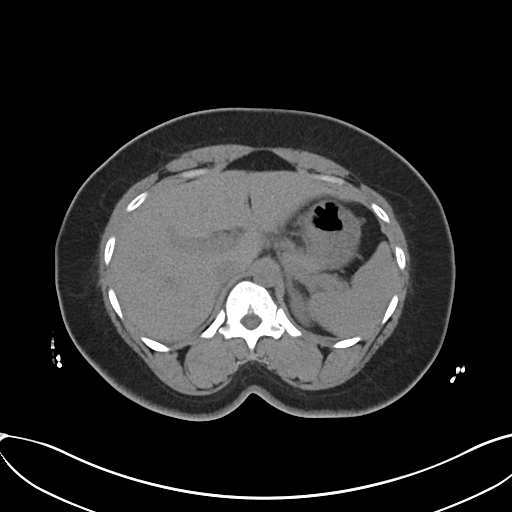
[im 81/92  soft-tissue]
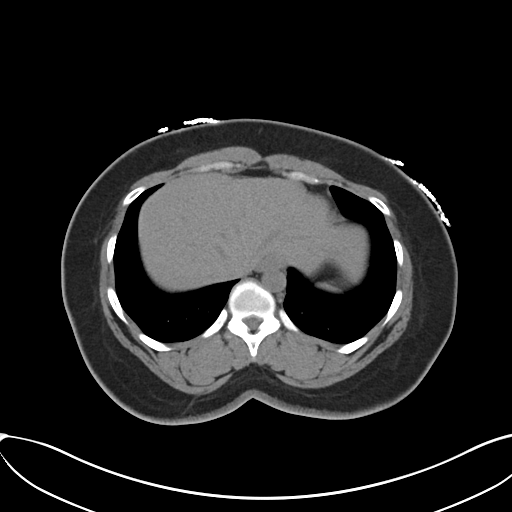
[im 88/92  soft-tissue]
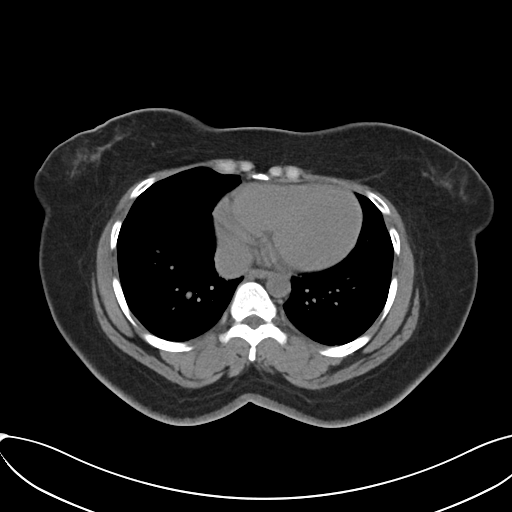

[Series 5: coronal soft tissue · coronal · 0.91mm/px · 3 of 101 slices shown]
[im 34/101  soft-tissue]
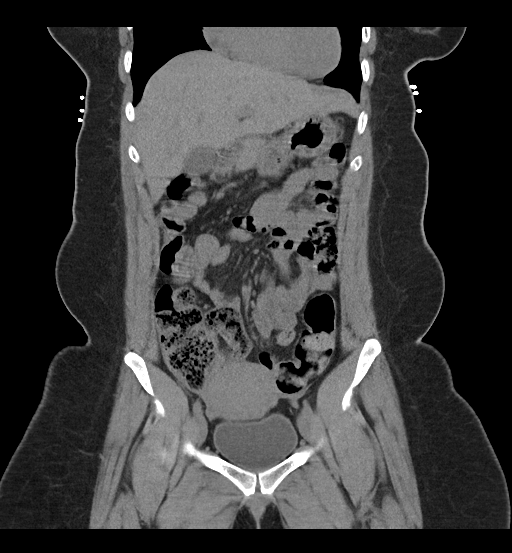
[im 45/101  soft-tissue]
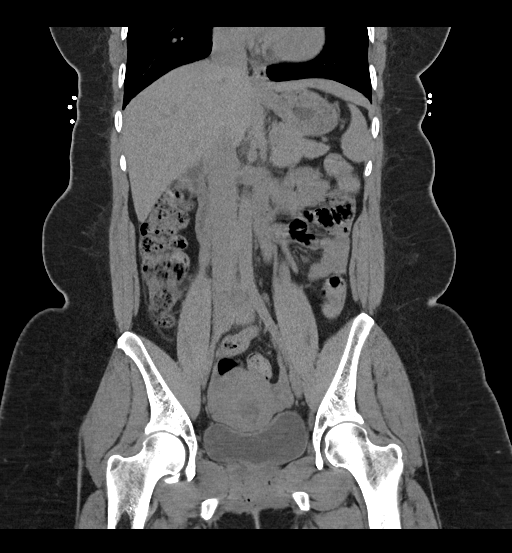
[im 56/101  soft-tissue]
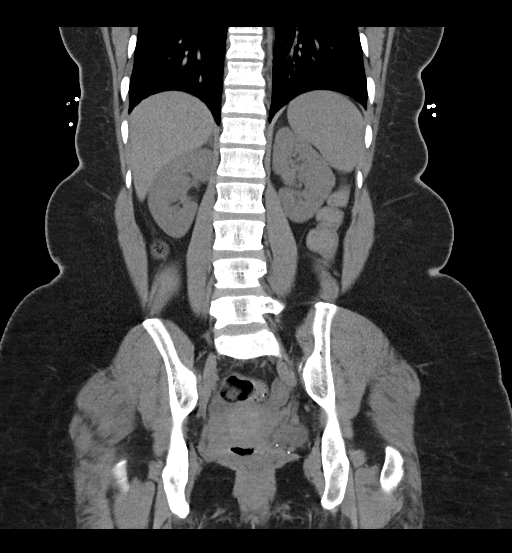

[16 of 46 positions shown; findings below may reference images not displayed]

FINDINGS: Lower chest:  Lung bases are clear.

Hepatobiliary: No focal liver lesions are identified on this
noncontrast enhanced study. The gallbladder wall does not appear
appreciably thickened. There is no biliary duct dilatation.

Pancreas: No pancreatic mass or inflammatory focus.

Spleen: No splenic lesions are identified.

Adrenals/Urinary Tract: Adrenals appear normal bilaterally. Kidneys
bilaterally show no mass or hydronephrosis on either side. There is
no renal or ureteral calculus on either side. The urinary bladder is
midline with wall thickness within normal limits. There are several
phleboliths in the pelvis which are felt to be separate from the
distal left ureter.

Stomach/Bowel: There is no bowel wall or mesenteric thickening.
There is no bowel obstruction. No free air or portal venous air.

Vascular/Lymphatic: There is no abdominal aortic aneurysm. No
vascular lesions are identified on this noncontrast enhanced study.
There is no demonstrable adenopathy in the abdomen or pelvis.

Reproductive: The uterus is anteverted. There is no pelvic mass.
There is moderate free fluid in the cul-de-sac region.

Other: The appendix appears normal. There is no abscess in the
abdomen or pelvis. There is a small ventral hernia. A small portion
of transverse colon protrudes into this ventral hernia without bowel
compromise.

Musculoskeletal: There are no blastic or lytic bone lesions. No
intramuscular or abdominal wall lesion.
IMPRESSION: Moderate free fluid in the cul-de-sac. Suspect recent ovarian cyst
rupture.

No demonstrable renal or ureteral calculus.  No hydronephrosis.

Appendix appears normal.  No bowel obstruction.  No abscess.

Ventral hernia containing a small amount of large bowel but no bowel
compromise.
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
## 2017-04-25 ENCOUNTER — Ambulatory Visit (HOSPITAL_COMMUNITY)
Admission: EM | Admit: 2017-04-25 | Discharge: 2017-04-25 | Disposition: A | Discharge status: Home / Self Care | Payer: Managed Care, Other (non HMO) | Attending: Family Medicine | Admitting: Family Medicine

## 2017-04-25 ENCOUNTER — Encounter (HOSPITAL_COMMUNITY): Payer: Self-pay | Admitting: Emergency Medicine

## 2017-04-25 ENCOUNTER — Other Ambulatory Visit: Payer: Self-pay

## 2017-04-25 ENCOUNTER — Ambulatory Visit (INDEPENDENT_AMBULATORY_CARE_PROVIDER_SITE_OTHER): Payer: Managed Care, Other (non HMO)

## 2017-04-25 DIAGNOSIS — R0789 Other chest pain: Secondary | ICD-10-CM

## 2017-04-25 DIAGNOSIS — M940 Chondrocostal junction syndrome [Tietze]: Secondary | ICD-10-CM | POA: Diagnosis not present

## 2017-04-25 MED ORDER — NAPROXEN 500 MG PO TABS: 500.0000 mg | ORAL_TABLET | Freq: Two times a day (BID) | ORAL | 0 refills | Status: DC

## 2017-04-25 MED ORDER — KETOROLAC TROMETHAMINE 60 MG/2ML IM SOLN
INTRAMUSCULAR | Status: AC
  Filled 2017-04-25: qty 2

## 2017-04-25 MED ORDER — KETOROLAC TROMETHAMINE 60 MG/2ML IM SOLN
60.0000 mg | Freq: Once | INTRAMUSCULAR | Status: AC
  Administered 2017-04-25: 60 mg via INTRAMUSCULAR

## 2017-04-25 NOTE — ED Provider Notes (Signed)
[facility]    CSN: [id number] Arrival date & time: 04/25/17  1954     History   Chief Complaint Chief Complaint  Patient presents with  . Back Pain    HPI Shawna Morris is a 42 y.o. female.   Shawna Morris presents with her husband with complaints of left chest wall pain which started this morning. She states pain is worse with deep breathing and any movement of her left arm. Pain is sternal as well as left lateral chest. Denies cough, shortness of breath, palpitations. 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 Has not taken any medications for her symptoms. Rates pain 9/10. She is not on any hormone therapy/birth control, chemo, no recent travel, without leg pain, does not smoke. Denies nausea, diaphoresis. Pain did not increase with eating. Denies any previous similar. Without medical history.     ROS per HPI.       Past Medical History:  Diagnosis Date  . Complication of anesthesia    states "last epidural did not work" wants to be "asleep"  . No pertinent past medical history     There are no active problems to display for this patient.   Past Surgical History:  Procedure Laterality Date  . CESAREAN SECTION      OB History    Gravida Para Term Preterm AB Living   3 3 3  0 0 4   SAB TAB Ectopic Multiple Live Births   0 0 0 1 2       Home Medications    Prior to Admission medications   Medication Sig Start Date End Date Taking? Authorizing Provider  naproxen (NAPROSYN) 500 MG tablet Take 1 tablet (500 mg total) by mouth 2 (two) times daily. 04/25/17   Georgetta HaberBurky, Arshad Oberholzer B, NP    Family History Family History  Problem Relation Age of Onset  . Anesthesia problems Neg Hx     Social History Social History   Tobacco Use  . Smoking status: Never Smoker  . Smokeless tobacco: Never Used  Substance Use Topics  . Alcohol use: No  . Drug use: No     Allergies    Aspirin   Review of Systems Review of Systems   Physical Exam Triage Vital Signs ED Triage Vitals  Enc Vitals Group     BP 04/25/17 2021 109/70     Pulse Rate 04/25/17 2021 81     Resp 04/25/17 2021 18     Temp 04/25/17 2021 99.2 F (37.3 C)     Temp Source 04/25/17 2021 Oral     SpO2 04/25/17 2021 99 %     Weight --      Height --      Head Circumference --      Peak Flow --      Pain Score 04/25/17 2026 9     Pain Loc --      Pain Edu? --      Excl. in GC? --    No data found.  Updated Vital Signs BP 109/70 (BP Location: Right Arm)   Pulse 81   Temp 99.2 F (37.3 C) (Oral)   Resp 18   LMP 03/28/2017 (Exact Date)   SpO2 99%   Visual Acuity Right Eye Distance:   Left Eye Distance:   Bilateral Distance:    Right Eye Near:   Left Eye Near:    Bilateral Near:  Physical Exam  Constitutional: She is oriented to person, place, and time. She appears well-developed and well-nourished. No distress.  Cardiovascular: Normal rate, regular rhythm and normal heart sounds.  Pulmonary/Chest: Effort normal and breath sounds normal. She exhibits tenderness.  Sternal tenderness on palpation; left lateral ribs/musculature with pain with raising of left arm; external rotation of left arm increases sternal and rib pain    Abdominal: There is no tenderness.  Neurological: She is alert and oriented to person, place, and time.  Skin: Skin is warm and dry.   Ekg NSR 78 without acute changes noted   UC Treatments / Results  Labs (all labs ordered are listed, but only abnormal results are displayed) Labs Reviewed - No data to display  EKG  EKG Interpretation None       Radiology Dg Chest 2 View  Result Date: 04/25/2017 CLINICAL DATA:  Chest pain EXAM: CHEST  2 VIEW COMPARISON:  None. FINDINGS: Normal heart size. Normal mediastinal contour. No pneumothorax. No pleural effusion. Lungs appear clear, with no acute consolidative airspace disease and no pulmonary edema.  IMPRESSION: No active cardiopulmonary disease. Electronically Signed   By: Delbert Phenix M.D.   On: 04/25/2017 21:00    Procedures Procedures (including critical care time)  Medications Ordered in UC Medications  ketorolac (TORADOL) injection 60 mg (60 mg Intramuscular Given 04/25/17 2055)     Initial Impression / Assessment and Plan / UC Course  I have reviewed the triage vital signs and the nursing notes.  Pertinent labs & imaging results that were available during my care of the patient were reviewed by me and considered in my medical decision making (see chart for details).     Pain is reproducible with movement of left arm as well as palpation. Without tachycardia, tachypnea, hypoxia. Without cardiac or PE risk factors. Without shortness of breath. ekg reassuring. Chest xray without acute findings. Will treat with NSAIDS at this time. toradol given in clinic, naproxen BID. Encouraged recheck with PCP in the next week. Return precautions provided. Patient verbalized understanding and agreeable to plan.    Final Clinical Impressions(s) / UC Diagnoses   Final diagnoses:  Chest wall pain  Costochondritis    ED Discharge Orders        Ordered    naproxen (NAPROSYN) 500 MG tablet  2 times daily     04/25/17 2104       Controlled Substance Prescriptions [place] Controlled Substance Registry consulted? Not Applicable   Georgetta Haber, NP 04/25/17 2105

## 2017-04-25 NOTE — Discharge Instructions (Signed)
Please take naproxen twice a day, take with food. Light stretching of your arm and chest. Activity as tolerated. Ice to the area may be helpful. If you develop increased pain, shortness of breath, palpitations, nausea, sweating, arm pain or otherwise worsening please go to the ER for further evaluation. Please make an appointment with your primary care provider for a recheck of symptoms in the next week.

## 2017-04-25 NOTE — ED Triage Notes (Signed)
[facility] a complaint of upper back pain when she raises her right arm that started today.

## 2018-03-11 ENCOUNTER — Ambulatory Visit (HOSPITAL_COMMUNITY)
Admission: EM | Admit: 2018-03-11 | Discharge: 2018-03-11 | Disposition: A | Discharge status: Home / Self Care | Payer: Managed Care, Other (non HMO) | Attending: Family Medicine | Admitting: Family Medicine

## 2018-03-11 ENCOUNTER — Other Ambulatory Visit: Payer: Self-pay

## 2018-03-11 ENCOUNTER — Encounter (HOSPITAL_COMMUNITY): Payer: Self-pay

## 2018-03-11 DIAGNOSIS — J111 Influenza due to unidentified influenza virus with other respiratory manifestations: Secondary | ICD-10-CM

## 2018-03-11 DIAGNOSIS — R69 Illness, unspecified: Secondary | ICD-10-CM | POA: Diagnosis not present

## 2018-03-11 MED ORDER — OSELTAMIVIR PHOSPHATE 75 MG PO CAPS: 75.0000 mg | ORAL_CAPSULE | Freq: Two times a day (BID) | ORAL | 0 refills | Status: AC

## 2018-03-11 NOTE — Discharge Instructions (Signed)

## 2018-03-11 NOTE — ED Triage Notes (Signed)
Pt cc cough , headaches and chest congestion. This started last night.

## 2018-03-11 NOTE — ED Provider Notes (Signed)
[facility]   [id number] 03/11/18 Arrival Time: 1138  ASSESSMENT & PLAN:  1. Influenza-like illness    See AVS for d/c instructions.  Meds ordered this encounter  Medications  . oseltamivir (TAMIFLU) 75 MG capsule    Sig: Take 1 capsule (75 mg total) by mouth 2 (two) times daily for 5 days.    Dispense:  10 capsule    Refill:  0   Work note given. Discussed typical duration of symptoms. OTC symptom care as needed. Ensure adequate fluid intake and rest. May f/u with PCP or here as needed.  Reviewed expectations re: course of current medical issues. Questions answered. Outlined signs and symptoms indicating need for more acute intervention. Patient verbalized understanding. After Visit Summary given.   SUBJECTIVE: History from: patient.  Shawna IraniCecilia Morris is a 43 y.o. female who presents with complaint of nasal congestion, post-nasal drainage. Slight dry cough. No sore throat. Onset abrupt, yesterday. Overall with fatigue and with body aches. SOB: none. Wheezing: none. Fever: yes, questions subjective. Overall normal PO intake without n/v. Known sick contacts: no. No specific or significant aggravating or alleviating factors reported. Mild headache. OTC treatment: none reported.  Received flu shot this year: no.  Social History   Tobacco Use  Smoking Status Never Smoker  Smokeless Tobacco Never Used    ROS: As per HPI.   OBJECTIVE:  Vitals:   03/11/18 1316  BP: 131/70  Pulse: 84  Resp: 18  Temp: 98.3 F (36.8 C)  TempSrc: Oral  SpO2: 100%  Weight: 104.3 kg     General appearance: alert; appears fatigued HEENT: nasal congestion; clear runny nose; throat irritation secondary to post-nasal drainage Neck: supple without LAD CV: RRR Lungs: unlabored respirations, symmetrical air entry without wheezing; cough: mild Abd: soft Ext: no LE edema Skin: warm and dry Psychological: alert and cooperative; normal mood and affect   Allergies  Allergen  Reactions  . Aspirin Other (See Comments)    bleeding    Past Medical History:  Diagnosis Date  . Complication of anesthesia    states "last epidural did not work" wants to be "asleep"  . No pertinent past medical history    Family History  Problem Relation Age of Onset  . Anesthesia problems Neg Hx    Social History   Socioeconomic History  . Marital status: Single    Spouse name: Not on file  . Number of children: Not on file  . Years of education: Not on file  . Highest education level: Not on file  Occupational History  . Not on file  Social Needs  . Financial resource strain: Not on file  . Food insecurity:    Worry: Not on file    Inability: Not on file  . Transportation needs:    Medical: Not on file    Non-medical: Not on file  Tobacco Use  . Smoking status: Never Smoker  . Smokeless tobacco: Never Used  Substance and Sexual Activity  . Alcohol use: No  . Drug use: No  . Sexual activity: Yes  Lifestyle  . Physical activity:    Days per week: Not on file    Minutes per session: Not on file  . Stress: Not on file  Relationships  . Social connections:    Talks on phone: Not on file    Gets together: Not on file    Attends religious service: Not on file    Active member of club or organization: Not on file  Attends meetings of clubs or organizations: Not on file    Relationship status: Not on file  . Intimate partner violence:    Fear of current or ex partner: Not on file    Emotionally abused: Not on file    Physically abused: Not on file    Forced sexual activity: Not on file  Other Topics Concern  . Not on file  Social History Narrative  . Not on file           Mardella LaymanHagler, Nyeem Stoke, MD 03/13/18 1326

## 2018-07-14 ENCOUNTER — Other Ambulatory Visit: Payer: Self-pay

## 2018-07-14 ENCOUNTER — Encounter (HOSPITAL_COMMUNITY): Payer: Self-pay | Admitting: Emergency Medicine

## 2018-07-14 ENCOUNTER — Ambulatory Visit (HOSPITAL_COMMUNITY)
Admission: EM | Admit: 2018-07-14 | Discharge: 2018-07-14 | Disposition: A | Discharge status: Home / Self Care | Payer: Managed Care, Other (non HMO) | Attending: Emergency Medicine | Admitting: Emergency Medicine

## 2018-07-14 DIAGNOSIS — J189 Pneumonia, unspecified organism: Secondary | ICD-10-CM

## 2018-07-14 MED ORDER — AMOXICILLIN-POT CLAVULANATE 875-125 MG PO TABS: 1.0000 | ORAL_TABLET | Freq: Two times a day (BID) | ORAL | 0 refills | Status: AC

## 2018-07-14 MED ORDER — ALBUTEROL SULFATE HFA 108 (90 BASE) MCG/ACT IN AERS: 1.0000 | INHALATION_SPRAY | Freq: Four times a day (QID) | RESPIRATORY_TRACT | 0 refills | Status: DC | PRN

## 2018-07-14 MED ORDER — AZITHROMYCIN 250 MG PO TABS: ORAL_TABLET | ORAL | 0 refills | Status: AC

## 2018-07-14 NOTE — ED Triage Notes (Signed)
Per pt she has been having a cough for about 1 week and chest tightness. Pt said yesterday she started having sore throat and nasal drainage. Pt said she keeps having to clear her throat from drainage. Pt says she feels like she is wheezing.

## 2018-07-14 NOTE — Discharge Instructions (Signed)
I am worried about pneumonia, which may be caused by Covid-19. We are currently not providing testing for this, however.  We will cover potential bacterial infection with antibiotics as well.  Use of inhaler as needed for wheezing or shortness of breath.   Tylenol and/or ibuprofen as needed for pain or fevers.   If worsening of chest pain , shortness of breath , difficulty breathing or otherwise worsening please go to the ER.  Please self-isolate for at least 1 more week or until you have had improvement of symptoms for at least 3 days, whichever is longer.

## 2018-07-14 NOTE — ED Provider Notes (Signed)
[facility]    CSN: [id number] Arrival date & time: 07/14/18  [id number]     History   Chief Complaint Chief Complaint  Patient presents with  . Cough  . Chest Pain    HPI Shawna Morris is a 43 y.o. female.   Shawna Morris presents with complaints of cough which started 1 week ago. Lemon ginger tea has helped some. Sore throat started last night, clearing throat frequently through night. Chest tightness this morning. This has improved and is only minimally present. Felt the chest tightness with her coughing. Decreased cough, but still throat clearing. No fevers. No gi/gu complaints. No known ill contacts. Works at [employer], she is an Midwifeinspector. No specific known ill. No ear pain. Throat pain with cough. No smoking history. No other medications. Without contributing medical history.      ROS per HPI, negative if not otherwise mentioned.         Past Medical History:  Diagnosis Date  . Complication of anesthesia    states "last epidural did not work" wants to be "asleep"  . No pertinent past medical history     There are no active problems to display for this patient.   Past Surgical History:  Procedure Laterality Date  . CESAREAN SECTION      OB History    Gravida  3   Para  3   Term  3   Preterm  0   AB  0   Living  4     SAB  0   TAB  0   Ectopic  0   Multiple  1   Live Births  2            Home Medications    Prior to Admission medications   Medication Sig Start Date End Date Taking? Authorizing Provider  albuterol (PROAIR HFA) 108 (90 Base) MCG/ACT inhaler Inhale 1-2 puffs into the lungs every 6 (six) hours as needed for wheezing or shortness of breath. 07/14/18   Shawna HaberBurky,  Morris, Shawna Morris  amoxicillin-clavulanate (AUGMENTIN) 875-125 MG tablet Take 1 tablet by mouth every 12 (twelve) hours for 10 days. 07/14/18 07/24/18  Shawna HaberBurky,  Morris, Shawna Morris  azithromycin (ZITHROMAX) 250 MG tablet Take 2 tablets (500 mg  total) by mouth daily for 1 day, THEN 1 tablet (250 mg total) daily for 4 days. 07/14/18 07/19/18  Shawna HaberBurky,  Morris, Shawna Morris  naproxen (NAPROSYN) 500 MG tablet Take 1 tablet (500 mg total) by mouth 2 (two) times daily. 04/25/17   Shawna HaberBurky,  Morris, Shawna Morris    Family History Family History  Problem Relation Age of Onset  . Anesthesia problems Neg Hx     Social History Social History   Tobacco Use  . Smoking status: Never Smoker  . Smokeless tobacco: Never Used  Substance Use Topics  . Alcohol use: No  . Drug use: No     Allergies   Aspirin   Review of Systems Review of Systems   Physical Exam Triage Vital Signs ED Triage Vitals [07/14/18 0935]  Enc Vitals Group     BP 113/80     Pulse Rate 91     Resp 16     Temp 98.9 F (37.2 C)     Temp Source Oral     SpO2 98 %     Weight      Height      Head Circumference      Peak Flow  Pain Score      Pain Loc      Pain Edu?      Excl. in [place]?    No data found.  Updated Vital Signs BP 113/80 (BP Location: Left Arm)   Pulse 91   Temp 98.9 F (37.2 C) (Oral)   Resp 16   LMP 07/03/2018 (Exact Date)   SpO2 98%    Physical Exam Constitutional:      Appearance: She is well-developed.  HENT:     Head: Normocephalic.  Cardiovascular:     Rate and Rhythm: Normal rate.     Heart sounds: Normal heart sounds.  Pulmonary:     Effort: Pulmonary effort is normal.     Breath sounds: Examination of the right-lower field reveals rhonchi. Examination of the left-lower field reveals rhonchi. Rhonchi present.  Neurological:     Mental Status: She is alert.      UC Treatments / Results  Labs (all labs ordered are listed, but only abnormal results are displayed) Labs Reviewed - No data to display  EKG None  Radiology No results found.  Procedures Procedures (including critical care time)  Medications Ordered in UC Medications - No data to display  Initial Impression / Assessment and Plan / UC Course  I have reviewed  the triage vital signs and the nursing notes.  Pertinent labs & imaging results that were available during my care of the patient were reviewed by me and considered in my medical decision making (see chart for details).    Non toxic in appearance. Afberile. No increased work of breathing. No hypoxia or tachycardia. Rhonchi on auscultation. Will treat as CAP at this time as symptoms for 1 week now and feels worse. Covid potential as well, works in [employer]. Return precautions provided. Patient verbalized understanding and agreeable to plan.    Final Clinical Impressions(s) / UC Diagnoses   Final diagnoses:  Community acquired pneumonia, unspecified laterality     Discharge Instructions     I am worried about pneumonia, which may be caused by Covid-19. We are currently not providing testing for this, however.  We will cover potential bacterial infection with antibiotics as well.  Use of inhaler as needed for wheezing or shortness of breath.   Tylenol and/or ibuprofen as needed for pain or fevers.   If worsening of chest pain , shortness of breath , difficulty breathing or otherwise worsening please go to the ER.  Please self-isolate for at least 1 more week or until you have had improvement of symptoms for at least 3 days, whichever is longer.     ED Prescriptions    Medication Sig Dispense Auth. Provider   amoxicillin-clavulanate (AUGMENTIN) 875-125 MG tablet Take 1 tablet by mouth every 12 (twelve) hours for 10 days. 20 tablet Shawna Mako Morris, Shawna Morris   azithromycin (ZITHROMAX) 250 MG tablet Take 2 tablets (500 mg total) by mouth daily for 1 day, THEN 1 tablet (250 mg total) daily for 4 days. 6 tablet Shawna Mako Morris, Shawna Morris   albuterol (PROAIR HFA) 108 (90 Base) MCG/ACT inhaler Inhale 1-2 puffs into the lungs every 6 (six) hours as needed for wheezing or shortness of breath. 1 Inhaler Shawna Haber, Shawna Morris     Controlled Substance Prescriptions [place] Controlled Substance Registry  consulted? Not Applicable   Shawna Haber, Shawna Morris 07/14/18 1011

## 2019-02-13 IMAGING — DX DG CHEST 2V
2 series · 2 of 2 positions shown · non-contrast
Comparison: None.

CLINICAL DATA: Chest pain

EXAM:
CHEST  2 VIEW

[chest pa]
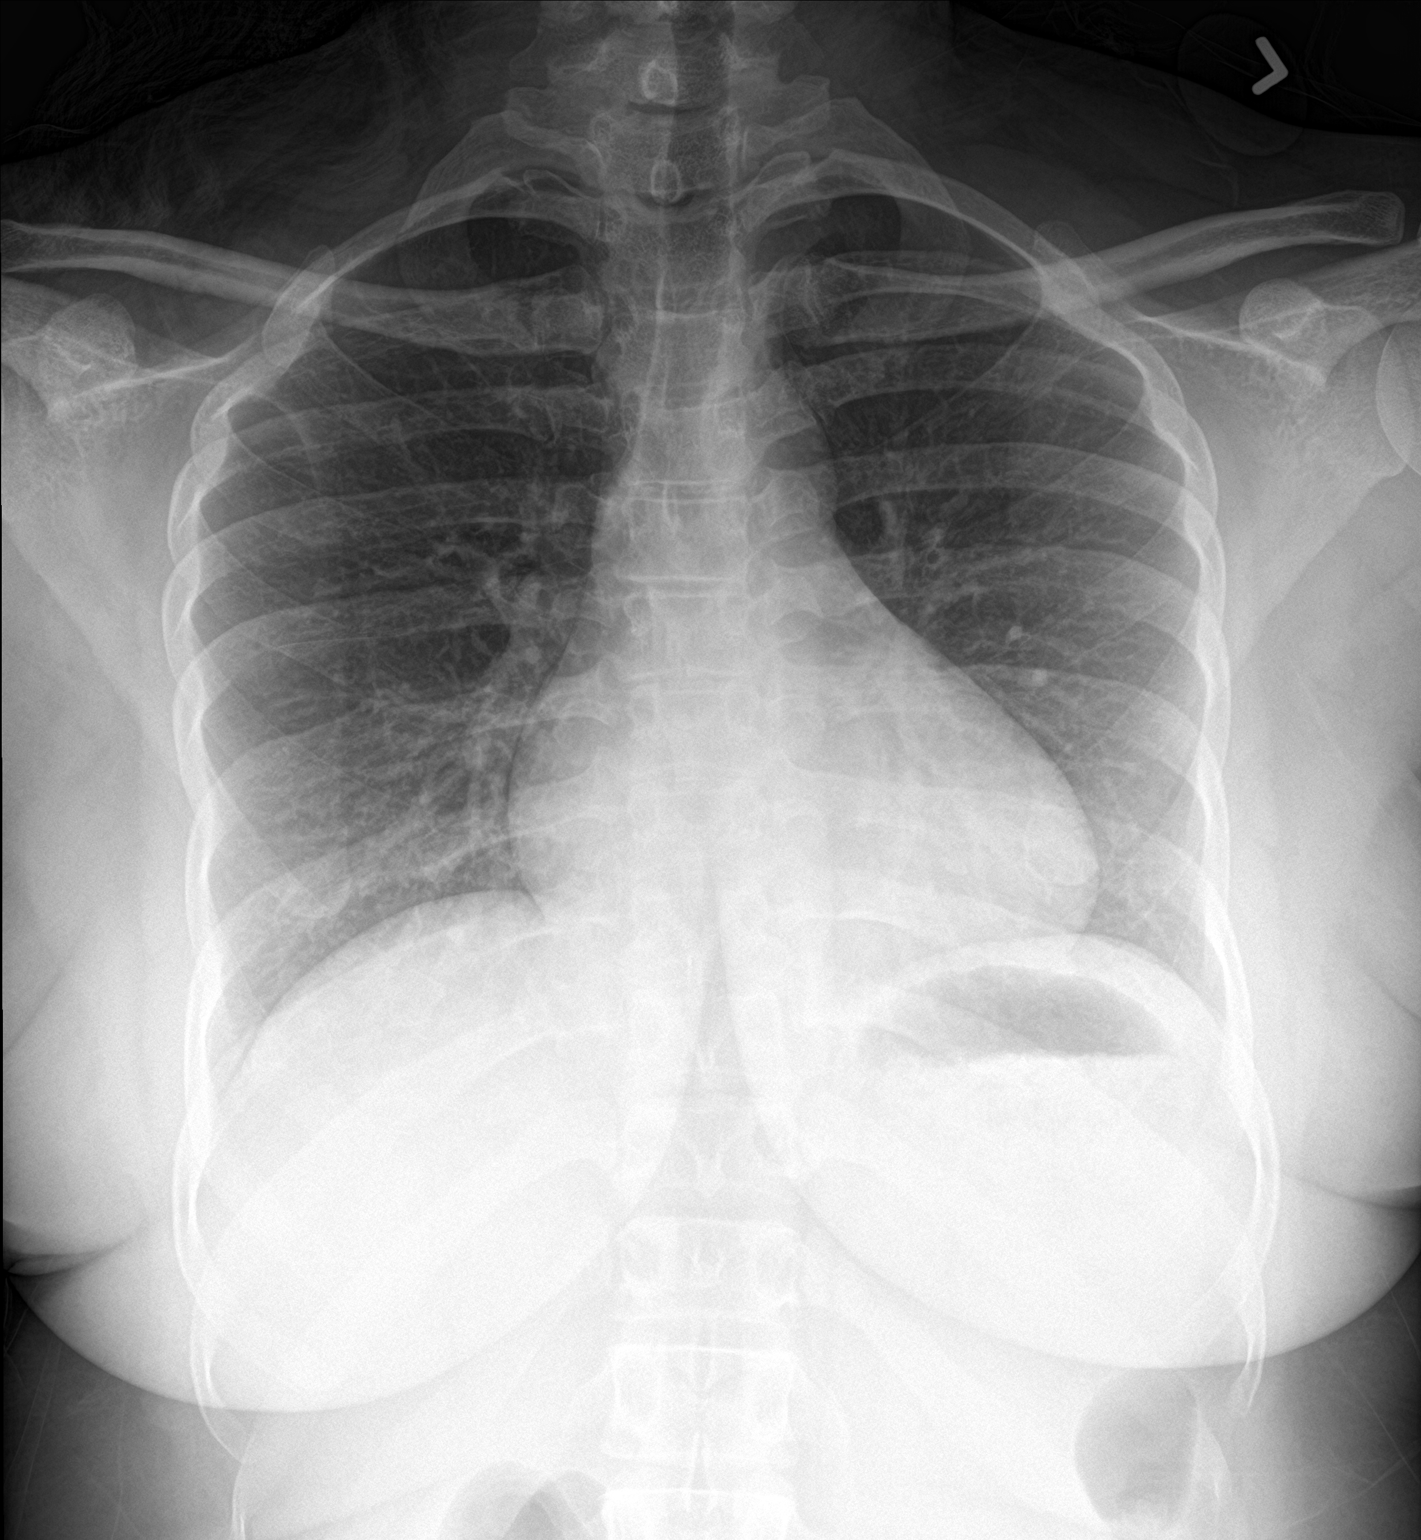

[chest lat]
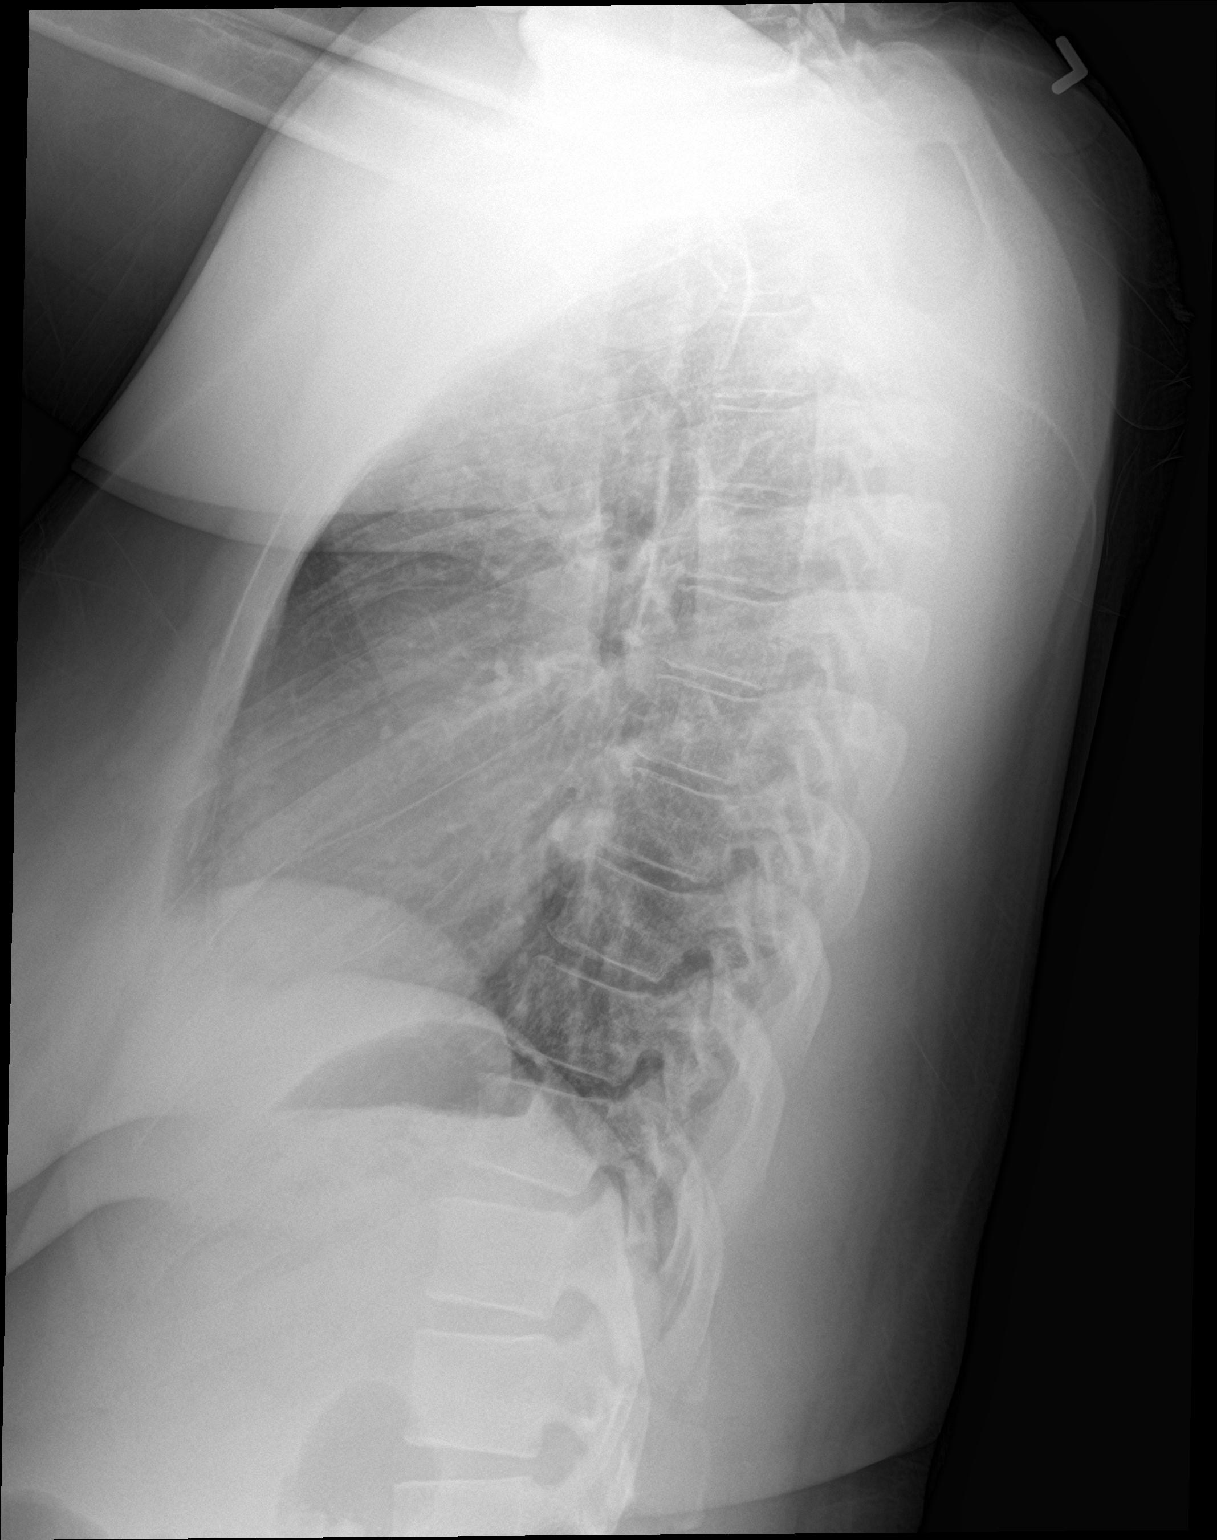

[2 of 2 positions shown; findings below may reference images not displayed]

FINDINGS: Normal heart size. Normal mediastinal contour. No pneumothorax. No
pleural effusion. Lungs appear clear, with no acute consolidative
airspace disease and no pulmonary edema.
IMPRESSION: No active cardiopulmonary disease.

## 2019-06-14 ENCOUNTER — Other Ambulatory Visit: Payer: Self-pay | Admitting: Otolaryngology

## 2020-05-12 ENCOUNTER — Other Ambulatory Visit: Payer: Self-pay | Admitting: Obstetrics and Gynecology

## 2020-05-12 DIAGNOSIS — R928 Other abnormal and inconclusive findings on diagnostic imaging of breast: Secondary | ICD-10-CM

## 2020-05-28 ENCOUNTER — Ambulatory Visit: Payer: Managed Care, Other (non HMO)

## 2020-05-28 ENCOUNTER — Other Ambulatory Visit: Payer: Self-pay

## 2020-05-28 ENCOUNTER — Ambulatory Visit
Admission: RE | Admit: 2020-05-28 | Discharge: 2020-05-28 | Disposition: A | Discharge status: Home / Self Care | Payer: Managed Care, Other (non HMO) | Source: Ambulatory Visit | Attending: Obstetrics and Gynecology | Admitting: Obstetrics and Gynecology

## 2020-05-28 DIAGNOSIS — R928 Other abnormal and inconclusive findings on diagnostic imaging of breast: Secondary | ICD-10-CM

## 2020-07-24 ENCOUNTER — Ambulatory Visit (HOSPITAL_COMMUNITY)
Admission: EM | Admit: 2020-07-24 | Discharge: 2020-07-24 | Disposition: A | Discharge status: Home / Self Care | Payer: Managed Care, Other (non HMO) | Attending: Internal Medicine | Admitting: Internal Medicine

## 2020-07-24 ENCOUNTER — Other Ambulatory Visit: Payer: Self-pay

## 2020-07-24 ENCOUNTER — Encounter (HOSPITAL_COMMUNITY): Payer: Self-pay

## 2020-07-24 DIAGNOSIS — R0789 Other chest pain: Secondary | ICD-10-CM

## 2020-07-24 DIAGNOSIS — K219 Gastro-esophageal reflux disease without esophagitis: Secondary | ICD-10-CM | POA: Diagnosis not present

## 2020-07-24 MED ORDER — IBUPROFEN 800 MG PO TABS: 800.0000 mg | ORAL_TABLET | Freq: Three times a day (TID) | ORAL | 0 refills | Status: DC

## 2020-07-24 MED ORDER — PANTOPRAZOLE SODIUM 20 MG PO TBEC: 20.0000 mg | DELAYED_RELEASE_TABLET | Freq: Every day | ORAL | 0 refills | Status: DC

## 2020-07-24 NOTE — Discharge Instructions (Addendum)
Take medications as directed Heating pad twice daily on a 10 minutes on-10 minutes off cycle x4 cycles for each session If you have worsening pain return to the urgent care to be reevaluated.

## 2020-07-24 NOTE — ED Triage Notes (Signed)
Pt c/o right side chest pain X 3 days. She states she has been having a headache and nausea. She denies coughing. Pt denies dizziness.

## 2020-07-28 NOTE — ED Provider Notes (Signed)
[facility]    CSN: [id number] Arrival date & time: 07/24/20  9211      History   Chief Complaint Chief Complaint  Patient presents with  . Chest Pain    Burning   . Nausea  . Headache    HPI Shawna Morris is a 45 y.o. female comes to the urgent care with 3-day history of right-sided chest pain.  Patient denies any fall or trauma.  No heavy lifting.  She describes the chest pain as burning in nature.  Is associated with some headache and nausea but no fever or chills.  She denies any cough or sputum production.  No radiation of pain.  Pain is not aggravated by movement.   No history of heart disease.  No family history of early cardiac death.  HPI  Past Medical History:  Diagnosis Date  . Complication of anesthesia    states "last epidural did not work" wants to be "asleep"  . No pertinent past medical history     There are no problems to display for this patient.   Past Surgical History:  Procedure Laterality Date  . CESAREAN SECTION      OB History    Gravida  3   Para  3   Term  3   Preterm  0   AB  0   Living  4     SAB  0   IAB  0   Ectopic  0   Multiple  1   Live Births  2            Home Medications    Prior to Admission medications   Medication Sig Start Date End Date Taking? Authorizing Provider  ibuprofen (ADVIL) 800 MG tablet Take 1 tablet (800 mg total) by mouth 3 (three) times daily. 07/24/20  Yes Ines Rebel, Britta Mccreedy, MD  pantoprazole (PROTONIX) 20 MG tablet Take 1 tablet (20 mg total) by mouth daily. 07/24/20  Yes Rogue Rafalski, Britta Mccreedy, MD  albuterol (PROAIR HFA) 108 (90 Base) MCG/ACT inhaler Inhale 1-2 puffs into the lungs every 6 (six) hours as needed for wheezing or shortness of breath. 07/14/18   Georgetta Haber, NP    Family History Family History  Problem Relation Age of Onset  . Anesthesia problems Neg Hx     Social History Social History   Tobacco Use  . Smoking status: Never Smoker  . Smokeless  tobacco: Never Used  Substance Use Topics  . Alcohol use: No  . Drug use: No     Allergies   Aspirin   Review of Systems Review of Systems  HENT: Negative.   Respiratory: Negative.   Cardiovascular: Positive for chest pain. Negative for palpitations.  Gastrointestinal: Negative.   Genitourinary: Negative.      Physical Exam Triage Vital Signs ED Triage Vitals  Enc Vitals Group     BP 07/24/20 1021 128/72     Pulse Rate 07/24/20 1021 70     Resp 07/24/20 1021 20     Temp 07/24/20 1021 98.6 F (37 C)     Temp Source 07/24/20 1021 Oral     SpO2 07/24/20 1021 100 %     Weight --      Height --      Head Circumference --      Peak Flow --      Pain Score 07/24/20 1018 8     Pain Loc --      Pain Edu? --  Excl. in GC? --    No data found.  Updated Vital Signs BP 128/72 (BP Location: Right Arm)   Pulse 70   Temp 98.6 F (37 C) (Oral)   Resp 20   LMP 07/16/2020 (Exact Date)   SpO2 100%   Visual Acuity Right Eye Distance:   Left Eye Distance:   Bilateral Distance:    Right Eye Near:   Left Eye Near:    Bilateral Near:     Physical Exam Vitals and nursing note reviewed.  Constitutional:      General: She is not in acute distress.    Appearance: She is not ill-appearing.  Cardiovascular:     Rate and Rhythm: Normal rate and regular rhythm.     Heart sounds: Normal heart sounds.  Pulmonary:     Effort: Pulmonary effort is normal.  Chest:     Chest wall: Tenderness present.     Comments: Tenderness over the xiphisternal joint.  No bruising.  Tenderness is reproducible and the characteristics of the pain is what the patient is describing. Abdominal:     Palpations: Abdomen is soft.  Neurological:     Mental Status: She is alert.      UC Treatments / Results  Labs (all labs ordered are listed, but only abnormal results are displayed) Labs Reviewed - No data to display  EKG   Radiology No results found.  Procedures Procedures  (including critical care time)  Medications Ordered in UC Medications - No data to display  Initial Impression / Assessment and Plan / UC Course  I have reviewed the triage vital signs and the nursing notes.  Pertinent labs & imaging results that were available during my care of the patient were reviewed by me and considered in my medical decision making (see chart for details).     1.  Chest wall pain: Ibuprofen 800 mg every 8 hours as needed for pain Protonix 20 mg orally daily Patient has a history of gastroesophageal reflux. Patient is advised to return to urgent care if symptoms worsen This clinical presentation is inconsistent with a cardiac cause of chest pain. Final Clinical Impressions(s) / UC Diagnoses   Final diagnoses:  Chest wall pain  Gastroesophageal reflux disease without esophagitis     Discharge Instructions     Take medications as directed Heating pad twice daily on a 10 minutes on-10 minutes off cycle x4 cycles for each session If you have worsening pain return to the urgent care to be reevaluated.   ED Prescriptions    Medication Sig Dispense Auth. Provider   ibuprofen (ADVIL) 800 MG tablet Take 1 tablet (800 mg total) by mouth 3 (three) times daily. 21 tablet Marijose Curington, Britta Mccreedy, MD   pantoprazole (PROTONIX) 20 MG tablet Take 1 tablet (20 mg total) by mouth daily. 30 tablet Arwilda Georgia, Britta Mccreedy, MD     PDMP not reviewed this encounter.   Merrilee Jansky, MD 07/28/20 [phone]

## 2021-03-21 ENCOUNTER — Other Ambulatory Visit: Payer: Self-pay

## 2021-03-21 ENCOUNTER — Ambulatory Visit (HOSPITAL_COMMUNITY)
Admission: EM | Admit: 2021-03-21 | Discharge: 2021-03-21 | Disposition: A | Discharge status: Home / Self Care | Payer: 59 | Attending: Student | Admitting: Student

## 2021-03-21 DIAGNOSIS — B349 Viral infection, unspecified: Secondary | ICD-10-CM | POA: Diagnosis present

## 2021-03-21 DIAGNOSIS — Z20822 Contact with and (suspected) exposure to covid-19: Secondary | ICD-10-CM | POA: Diagnosis not present

## 2021-03-21 DIAGNOSIS — Z1152 Encounter for screening for COVID-19: Secondary | ICD-10-CM

## 2021-03-21 LAB — POC INFLUENZA A AND B ANTIGEN (URGENT CARE ONLY)
INFLUENZA A ANTIGEN, POC: NEGATIVE
INFLUENZA B ANTIGEN, POC: NEGATIVE

## 2021-03-21 LAB — SARS CORONAVIRUS 2 (TAT 6-24 HRS): SARS Coronavirus 2: POSITIVE — AB

## 2021-03-21 MED ORDER — ONDANSETRON 8 MG PO TBDP: 8.0000 mg | ORAL_TABLET | Freq: Three times a day (TID) | ORAL | 0 refills | Status: DC | PRN

## 2021-03-21 NOTE — ED Triage Notes (Signed)
Pt presents to the office for body aches, fever and coughing that started yesterday.

## 2021-03-21 NOTE — ED Provider Notes (Signed)
[place]    CSN: [id number] Arrival date & time: 03/21/21  1043      History   Chief Complaint Chief Complaint  Patient presents with   Cough    Body aches     HPI Shawna Morris is a 46 y.o. female presenting with 2 days of myalgias, fevers, cough.  Medical history noncontributory, denies cardiopulm ds. Subjective chills, temperature running 98.2 at home. Cough is productive of clear sputum. Denies CP, dizziness, SOB, weakness. Nausea without vomiting. Denies v/d/abd pain. Has attempted ibuprofen with some relief, last dose 10 hours ago. States she is not pregnant or breastfeeding. Exposure to covid.  HPI  Past Medical History:  Diagnosis Date   Complication of anesthesia    states "last epidural did not work" wants to be "asleep"   No pertinent past medical history     There are no problems to display for this patient.   Past Surgical History:  Procedure Laterality Date   CESAREAN SECTION      OB History     Gravida  3   Para  3   Term  3   Preterm  0   AB  0   Living  4      SAB  0   IAB  0   Ectopic  0   Multiple  1   Live Births  2            Home Medications    Prior to Admission medications   Medication Sig Start Date End Date Taking? Authorizing Provider  ondansetron (ZOFRAN-ODT) 8 MG disintegrating tablet Take 1 tablet (8 mg total) by mouth every 8 (eight) hours as needed for nausea or vomiting. 03/21/21  Yes Hazel Sams, PA-C  albuterol [facility] HFA) 108 (90 Base) MCG/ACT inhaler Inhale 1-2 puffs into the lungs every 6 (six) hours as needed for wheezing or shortness of breath. 07/14/18   Zigmund Gottron, NP  ibuprofen (ADVIL) 800 MG tablet Take 1 tablet (800 mg total) by mouth 3 (three) times daily. 07/24/20   Lamptey, Myrene Galas, MD  pantoprazole (PROTONIX) 20 MG tablet Take 1 tablet (20 mg total) by mouth daily. 07/24/20   LampteyMyrene Galas, MD    Family History Family History  Problem Relation Age of Onset    Anesthesia problems Neg Hx     Social History Social History   Tobacco Use   Smoking status: Never   Smokeless tobacco: Never  Substance Use Topics   Alcohol use: No   Drug use: No     Allergies   Aspirin   Review of Systems Review of Systems  Constitutional:  Positive for chills. Negative for appetite change and fever.  HENT:  Positive for congestion. Negative for ear pain, rhinorrhea, sinus pressure, sinus pain and sore throat.   Eyes:  Negative for redness and visual disturbance.  Respiratory:  Positive for cough. Negative for chest tightness, shortness of breath and wheezing.   Cardiovascular:  Negative for chest pain and palpitations.  Gastrointestinal:  Positive for nausea. Negative for abdominal pain, constipation, diarrhea and vomiting.  Genitourinary:  Negative for dysuria, frequency and urgency.  Musculoskeletal:  Positive for myalgias.  Neurological:  Negative for dizziness, weakness and headaches.  Psychiatric/Behavioral:  Negative for confusion.   All other systems reviewed and are negative.   Physical Exam Triage Vital Signs ED Triage Vitals [03/21/21 1212]  Enc Vitals Group     BP 102/78     Pulse  Rate 82     Resp 16     Temp 98.8 F (37.1 C)     Temp src      SpO2 99 %     Weight      Height      Head Circumference      Peak Flow      Pain Score 4     Pain Loc      Pain Edu?      Excl. in [place]?    No data found.  Updated Vital Signs BP 102/78 (BP Location: Left Arm)    Pulse 82    Temp 98.8 F (37.1 C)    Resp 16    LMP  (LMP Unknown)    SpO2 99%   Visual Acuity Right Eye Distance:   Left Eye Distance:   Bilateral Distance:    Right Eye Near:   Left Eye Near:    Bilateral Near:     Physical Exam Vitals reviewed.  Constitutional:      General: She is not in acute distress.    Appearance: Normal appearance. She is not ill-appearing.  HENT:     Head: Normocephalic and atraumatic.     Right Ear: Tympanic membrane, ear canal and  external ear normal. No tenderness. No middle ear effusion. There is no impacted cerumen. Tympanic membrane is not perforated, erythematous, retracted or bulging.     Left Ear: Tympanic membrane, ear canal and external ear normal. No tenderness.  No middle ear effusion. There is no impacted cerumen. Tympanic membrane is not perforated, erythematous, retracted or bulging.     Nose: Nose normal. No congestion.     Mouth/Throat:     Mouth: Mucous membranes are moist.     Pharynx: Uvula midline. No oropharyngeal exudate or posterior oropharyngeal erythema.  Eyes:     Extraocular Movements: Extraocular movements intact.     Pupils: Pupils are equal, round, and reactive to light.  Cardiovascular:     Rate and Rhythm: Normal rate and regular rhythm.     Heart sounds: Normal heart sounds.  Pulmonary:     Effort: Pulmonary effort is normal.     Breath sounds: Normal breath sounds. No decreased breath sounds, wheezing, rhonchi or rales.  Abdominal:     General: Bowel sounds are increased.     Palpations: Abdomen is soft.     Tenderness: There is no abdominal tenderness. There is no guarding or rebound.  Lymphadenopathy:     Cervical: No cervical adenopathy.     Right cervical: No superficial cervical adenopathy.    Left cervical: No superficial cervical adenopathy.  Neurological:     General: No focal deficit present.     Mental Status: She is alert and oriented to person, place, and time.  Psychiatric:        Mood and Affect: Mood normal.        Behavior: Behavior normal.        Thought Content: Thought content normal.        Judgment: Judgment normal.     UC Treatments / Results  Labs (all labs ordered are listed, but only abnormal results are displayed) Labs Reviewed  SARS CORONAVIRUS 2 (TAT 6-24 HRS)  POC INFLUENZA A AND B ANTIGEN (URGENT CARE ONLY)    EKG   Radiology No results found.  Procedures Procedures (including critical care time)  Medications Ordered in  UC Medications - No data to display  Initial Impression / Assessment and Plan /  UC Course  I have reviewed the triage vital signs and the nursing notes.  Pertinent labs & imaging results that were available during my care of the patient were reviewed by me and considered in my medical decision making (see chart for details).     This patient is a very pleasant 46 y.o. year old female presenting with viral syndrome following exposure to covid. Today this pt is afebrile nontachycardic nontachypneic, oxygenating well on room air, no wheezes rhonchi or rales. States she is not pregnant or breastfeeding.  Rapid influenza negative.  Exposure to influenza. Covid PCR sent. No pulm ds and mild disease- not a candidate for antiviral.   Zofran ODT sent.   ED return precautions discussed. Patient verbalizes understanding and agreement.     Final Clinical Impressions(s) / UC Diagnoses   Final diagnoses:  Viral syndrome  Exposure to confirmed case of COVID-19  Encounter for screening for COVID-19     Discharge Instructions      -Take the Zofran (ondansetron) up to 3 times daily for nausea and vomiting. Dissolve one pill under your tongue or between your teeth and your cheek. -You can take Tylenol up to 1000 mg 3 times daily, and ibuprofen up to 600 mg 3 times daily with food.  You can take these together, or alternate every 3-4 hours. -Drink plenty of fluids -Over-the-counter medications like Mucinex -With a virus, you're typically contagious for 5-7 days, or as long as you're having fevers.       ED Prescriptions     Medication Sig Dispense Auth. Provider   ondansetron (ZOFRAN-ODT) 8 MG disintegrating tablet Take 1 tablet (8 mg total) by mouth every 8 (eight) hours as needed for nausea or vomiting. 20 tablet Hazel Sams, PA-C      PDMP not reviewed this encounter.   Hazel Sams, PA-C 03/21/21 1257

## 2021-03-21 NOTE — Discharge Instructions (Signed)
-  Take the Zofran (ondansetron) up to 3 times daily for nausea and vomiting. Dissolve one pill under your tongue or between your teeth and your cheek. -You can take Tylenol up to 1000 mg 3 times daily, and ibuprofen up to 600 mg 3 times daily with food.  You can take these together, or alternate every 3-4 hours. -Drink plenty of fluids -Over-the-counter medications like Mucinex -With a virus, you're typically contagious for 5-7 days, or as long as you're having fevers.

## 2021-03-23 ENCOUNTER — Other Ambulatory Visit: Payer: Self-pay

## 2021-03-23 ENCOUNTER — Telehealth (HOSPITAL_COMMUNITY): Payer: Self-pay | Admitting: Emergency Medicine

## 2021-03-23 NOTE — Telephone Encounter (Signed)
Dr lamptey instructed patient to be given copy of covid test results.  Provided patient with copy, verified identity with 2 identifiers/ drivers license.  Patient accepted written results and left with no questions or requests

## 2021-09-14 ENCOUNTER — Other Ambulatory Visit: Payer: Self-pay | Admitting: Obstetrics and Gynecology

## 2021-09-14 DIAGNOSIS — R928 Other abnormal and inconclusive findings on diagnostic imaging of breast: Secondary | ICD-10-CM

## 2022-03-04 ENCOUNTER — Encounter (HOSPITAL_COMMUNITY): Payer: Self-pay | Admitting: *Deleted

## 2022-03-04 ENCOUNTER — Ambulatory Visit (HOSPITAL_COMMUNITY)
Admission: EM | Admit: 2022-03-04 | Discharge: 2022-03-04 | Disposition: A | Discharge status: Home / Self Care | Payer: 59 | Attending: Sports Medicine | Admitting: Sports Medicine

## 2022-03-04 DIAGNOSIS — R52 Pain, unspecified: Secondary | ICD-10-CM

## 2022-03-04 DIAGNOSIS — R051 Acute cough: Secondary | ICD-10-CM

## 2022-03-04 LAB — POC INFLUENZA A AND B ANTIGEN (URGENT CARE ONLY)
INFLUENZA A ANTIGEN, POC: POSITIVE — AB
INFLUENZA B ANTIGEN, POC: NEGATIVE

## 2022-03-04 MED ORDER — ONDANSETRON HCL 4 MG PO TABS: 4.0000 mg | ORAL_TABLET | Freq: Three times a day (TID) | ORAL | 0 refills | Status: DC | PRN

## 2022-03-04 MED ORDER — OSELTAMIVIR PHOSPHATE 75 MG PO CAPS: 75.0000 mg | ORAL_CAPSULE | Freq: Two times a day (BID) | ORAL | 0 refills | Status: AC

## 2022-03-04 MED ORDER — DEXTROMETHORPHAN HBR 15 MG/5ML PO SYRP: 10.0000 mL | ORAL_SOLUTION | Freq: Four times a day (QID) | ORAL | 0 refills | Status: DC | PRN

## 2022-03-04 NOTE — Discharge Instructions (Addendum)
Today you tested positive for flu A.  I have sent Tamiflu, and antiviral medication for your flu to your pharmacy.  Take 1 pill twice a day for the next 5 days.  I have also sent to your pharmacy some medicine for nausea and cough syrup. You may return to work after 3 days as long as you are fever free for 24 hours.

## 2022-03-04 NOTE — ED Provider Notes (Signed)
[facility]    CSN: [id number] Arrival date & time: 03/04/22  1014      History   Chief Complaint Chief Complaint  Patient presents with   Cough   Headache    HPI Shawna Morris is a 46 y.o. female.   She presented today with chief complaint of cough, headache, fatigue and generalized bodyaches for the past 2 days.  She reports her husband also began getting sick on Tuesday however he is beginning to feel better she is progressively worsening.  She reports she has a very dry nagging cough.  Her temperature today is 100 after ibuprofen but she states she has felt warm for the past day.  She has not done any home COVID testing.  She denies any nausea or vomiting but endorses decreased appetite.   Cough Associated symptoms: headaches   Headache Associated symptoms: cough     Past Medical History:  Diagnosis Date   Complication of anesthesia    states "last epidural did not work" wants to be "asleep"   No pertinent past medical history     There are no problems to display for this patient.   Past Surgical History:  Procedure Laterality Date   CESAREAN SECTION      OB History     Gravida  3   Para  3   Term  3   Preterm  0   AB  0   Living  4      SAB  0   IAB  0   Ectopic  0   Multiple  1   Live Births  2            Home Medications    Prior to Admission medications   Medication Sig Start Date End Date Taking? Authorizing Provider  dextromethorphan 15 MG/5ML syrup Take 10 mLs (30 mg total) by mouth 4 (four) times daily as needed for cough. 03/04/22  Yes Gillermo Murdoch A, DO  ondansetron (ZOFRAN) 4 MG tablet Take 1 tablet (4 mg total) by mouth every 8 (eight) hours as needed for nausea or vomiting. 03/04/22  Yes Gillermo Murdoch A, DO  oseltamivir (TAMIFLU) 75 MG capsule Take 1 capsule (75 mg total) by mouth 2 (two) times daily for 5 days. 03/04/22 03/09/22 Yes Dedria Endres A, DO  albuterol (PROAIR HFA) 108 (90 Base)  MCG/ACT inhaler Inhale 1-2 puffs into the lungs every 6 (six) hours as needed for wheezing or shortness of breath. 07/14/18   Georgetta Haber, NP  ibuprofen (ADVIL) 800 MG tablet Take 1 tablet (800 mg total) by mouth 3 (three) times daily. 07/24/20   Lamptey, Britta Mccreedy, MD  pantoprazole (PROTONIX) 20 MG tablet Take 1 tablet (20 mg total) by mouth daily. 07/24/20   LampteyBritta Mccreedy, MD    Family History Family History  Problem Relation Age of Onset   Anesthesia problems Neg Hx     Social History Social History   Tobacco Use   Smoking status: Never   Smokeless tobacco: Never  Substance Use Topics   Alcohol use: No   Drug use: No     Allergies   Aspirin   Review of Systems Review of Systems  Respiratory:  Positive for cough.   Neurological:  Positive for headaches.  As listed above in HPI   Physical Exam Triage Vital Signs ED Triage Vitals  Enc Vitals Group     BP 03/04/22 1215 139/83     Pulse Rate 03/04/22 1215  84     Resp 03/04/22 1215 18     Temp 03/04/22 1215 100 F (37.8 C)     Temp Source 03/04/22 1215 Oral     SpO2 03/04/22 1215 98 %     Weight --      Height --      Head Circumference --      Peak Flow --      Pain Score 03/04/22 1213 8     Pain Loc --      Pain Edu? --      Excl. in [place]? --    No data found.  Updated Vital Signs BP 139/83 (BP Location: Left Arm)   Pulse 84   Temp 100 F (37.8 C) (Oral)   Resp 18   LMP 02/22/2022 (Approximate)   SpO2 98%   Physical Exam Vitals reviewed.  Constitutional:      General: She is not in acute distress.    Appearance: She is well-developed and normal weight. She is not toxic-appearing or diaphoretic.  HENT:     Head: Normocephalic.     Mouth/Throat:     Mouth: Mucous membranes are moist.  Cardiovascular:     Rate and Rhythm: Normal rate.     Heart sounds: Normal heart sounds. No murmur heard. Pulmonary:     Effort: Pulmonary effort is normal. No respiratory distress.     Breath sounds: No  stridor. No wheezing, rhonchi or rales.  Skin:    General: Skin is warm.  Neurological:     Mental Status: She is alert and oriented to person, place, and time.      UC Treatments / Results  Labs (all labs ordered are listed, but only abnormal results are displayed) Labs Reviewed  POC INFLUENZA A AND B ANTIGEN (URGENT CARE ONLY) - Abnormal; Notable for the following components:      Result Value   INFLUENZA A ANTIGEN, POC POSITIVE (*)    All other components within normal limits    EKG   Radiology No results found.  Procedures Procedures (including critical care time)  Medications Ordered in UC Medications - No data to display  Initial Impression / Assessment and Plan / UC Course  I have reviewed the triage vital signs and the nursing notes.  Pertinent labs & imaging results that were available during my care of the patient were reviewed by me and considered in my medical decision making (see chart for details).     Fatigue, cough and bodyaches.  Positive for influenza A.  Patient's symptoms began less than 48 hours ago so she is a candidate for Tamiflu.  I have sent Tamiflu to her pharmacy 1 pill twice a day for the next 5 days.  I also sent to her pharmacy dextromethorphan for cough and Zofran for nausea.  Recommend she continue with ibuprofen as needed, remember to remain adequately hydrated and try to eat small meals.  Work note was provided.  She may return to work after she is fever free for 24 hours. Final Clinical Impressions(s) / UC Diagnoses   Final diagnoses:  Acute cough  Body aches     Discharge Instructions      Today you tested positive for flu A.  I have sent Tamiflu, and antiviral medication for your flu to your pharmacy.  Take 1 pill twice a day for the next 5 days.  I have also sent to your pharmacy some medicine for nausea and cough syrup. You may return to  work after 3 days as long as you are fever free for 24 hours.    ED Prescriptions      Medication Sig Dispense Auth. Provider   ondansetron (ZOFRAN) 4 MG tablet Take 1 tablet (4 mg total) by mouth every 8 (eight) hours as needed for nausea or vomiting. 15 tablet Gillermo Murdoch A, DO   dextromethorphan 15 MG/5ML syrup Take 10 mLs (30 mg total) by mouth 4 (four) times daily as needed for cough. 120 mL Gillermo Murdoch A, DO   oseltamivir (TAMIFLU) 75 MG capsule Take 1 capsule (75 mg total) by mouth 2 (two) times daily for 5 days. 10 capsule Gillermo Murdoch A, DO      PDMP not reviewed this encounter.   Claudie Leach, DO 03/04/22 1352

## 2022-03-04 NOTE — ED Triage Notes (Signed)
Pt states she has had cough an headache since Tuesday. She is taking Ibu as needed.

## 2022-05-01 ENCOUNTER — Ambulatory Visit (HOSPITAL_COMMUNITY)
Admission: EM | Admit: 2022-05-01 | Discharge: 2022-05-01 | Disposition: A | Discharge status: Home / Self Care | Payer: 59 | Attending: Emergency Medicine | Admitting: Emergency Medicine

## 2022-05-01 ENCOUNTER — Other Ambulatory Visit: Payer: Self-pay

## 2022-05-01 ENCOUNTER — Encounter (HOSPITAL_COMMUNITY): Payer: Self-pay | Admitting: *Deleted

## 2022-05-01 DIAGNOSIS — S161XXA Strain of muscle, fascia and tendon at neck level, initial encounter: Secondary | ICD-10-CM

## 2022-05-01 MED ORDER — IBUPROFEN 600 MG PO TABS: 600.0000 mg | ORAL_TABLET | Freq: Three times a day (TID) | ORAL | 0 refills | Status: DC | PRN

## 2022-05-01 MED ORDER — ACETAMINOPHEN 325 MG PO TABS
650.0000 mg | ORAL_TABLET | Freq: Once | ORAL | Status: AC
  Administered 2022-05-01: 650 mg via ORAL

## 2022-05-01 MED ORDER — ACETAMINOPHEN 325 MG PO TABS
ORAL_TABLET | ORAL | Status: AC
  Filled 2022-05-01: qty 2

## 2022-05-01 MED ORDER — CYCLOBENZAPRINE HCL 10 MG PO TABS: 10.0000 mg | ORAL_TABLET | Freq: Two times a day (BID) | ORAL | 0 refills | Status: DC | PRN

## 2022-05-01 MED ORDER — ACETAMINOPHEN 325 MG PO TABS: 650.0000 mg | ORAL_TABLET | ORAL | 2 refills | Status: DC | PRN

## 2022-05-01 NOTE — ED Provider Notes (Signed)
[place]    CSN: [id number] Arrival date & time: 05/01/22  1059     History   Chief Complaint Chief Complaint  Patient presents with   Arm Pain   Neck Pain    HPI Shawna Morris is a 47 y.o. female.  Presents with right-sided neck pain Started yesterday Today 10/10 pain with movement  Tried 400 mg ibuprofen last night. No meds today  Denies any trauma or injury No numbness, tingling, weakness of extremities. No fever or recent illness. No back pain No history of similar  Past Medical History:  Diagnosis Date   Complication of anesthesia    states "last epidural did not work" wants to be "asleep"   No pertinent past medical history     There are no problems to display for this patient.   Past Surgical History:  Procedure Laterality Date   CESAREAN SECTION      OB History     Gravida  3   Para  3   Term  3   Preterm  0   AB  0   Living  4      SAB  0   IAB  0   Ectopic  0   Multiple  1   Live Births  2            Home Medications    Prior to Admission medications   Medication Sig Start Date End Date Taking? Authorizing Provider  acetaminophen (TYLENOL) 325 MG tablet Take 2 tablets (650 mg total) by mouth every 4 (four) hours as needed. 05/01/22  Yes Orena Cavazos, Wells Guiles, PA-C  cyclobenzaprine (FLEXERIL) 10 MG tablet Take 1 tablet (10 mg total) by mouth 2 (two) times daily as needed for muscle spasms. 05/01/22  Yes Raeshawn Tafolla, Wells Guiles, PA-C  ibuprofen (ADVIL) 600 MG tablet Take 1 tablet (600 mg total) by mouth 3 (three) times daily as needed. 05/01/22  Yes Zack Crager, Wells Guiles, PA-C    Family History Family History  Problem Relation Age of Onset   Anesthesia problems Neg Hx     Social History Social History   Tobacco Use   Smoking status: Never   Smokeless tobacco: Never  Substance Use Topics   Alcohol use: No   Drug use: No     Allergies   Aspirin   Review of Systems Review of Systems As per HPI  Physical  Exam Triage Vital Signs ED Triage Vitals  Enc Vitals Group     BP 05/01/22 1236 137/83     Pulse Rate 05/01/22 1236 78     Resp 05/01/22 1236 18     Temp 05/01/22 1236 98.1 F (36.7 C)     Temp src --      SpO2 05/01/22 1236 98 %     Weight --      Height --      Head Circumference --      Peak Flow --      Pain Score 05/01/22 1234 10     Pain Loc --      Pain Edu? --      Excl. in [place]? --    No data found.  Updated Vital Signs BP 137/83   Pulse 78   Temp 98.1 F (36.7 C)   Resp 18   LMP 04/24/2022   SpO2 98%     Physical Exam Vitals and nursing note reviewed.  Constitutional:      General: She is not in acute distress. HENT:  Mouth/Throat:     Mouth: Mucous membranes are moist.     Pharynx: Oropharynx is clear. No posterior oropharyngeal erythema.  Eyes:     Extraocular Movements: Extraocular movements intact.     Conjunctiva/sclera: Conjunctivae normal.     Pupils: Pupils are equal, round, and reactive to light.  Neck:     Comments: Decreased ROM mostly with looking R and L. No spinal tenderness. Muscular tenderness R trapezius and SCM. No bruising or skin changes Cardiovascular:     Rate and Rhythm: Normal rate and regular rhythm.     Pulses: Normal pulses.     Heart sounds: Normal heart sounds.  Pulmonary:     Effort: Pulmonary effort is normal.     Breath sounds: Normal breath sounds.  Musculoskeletal:     Cervical back: Muscular tenderness present. No spinous process tenderness. Decreased range of motion.     Comments: No spinal tenderness of C/T/L spine  Skin:    General: Skin is warm and dry.  Neurological:     Mental Status: She is alert and oriented to person, place, and time.     UC Treatments / Results  Labs (all labs ordered are listed, but only abnormal results are displayed) Labs Reviewed - No data to display  EKG  Radiology No results found.  Procedures Procedures   Medications Ordered in UC Medications  acetaminophen  (TYLENOL) tablet 650 mg (650 mg Oral Given 05/01/22 1308)    Initial Impression / Assessment and Plan / UC Course  I have reviewed the triage vital signs and the nursing notes.  Pertinent labs & imaging results that were available during my care of the patient were reviewed by me and considered in my medical decision making (see chart for details).  Tylenol dose given in clinic with minimal change Normal neuro exam, stable vitals  Discussed likely muscular etiology. Recommend ibu and tylenol, muscle relaxer, hot pad Return precautions discussed. Strict ED precautions if no change with symptomatic care. Patient agrees to plan  Final Clinical Impressions(s) / UC Diagnoses   Final diagnoses:  Acute strain of neck muscle, initial encounter     Discharge Instructions      I recommend to alternate tylenol and ibuprofen every 4-6 hours for pain control.  You can take the muscle relaxer twice daily.  Apply hot pad to the neck. Gentle stretching of the neck.   Please allow several more days for symptoms to improve and resolve       ED Prescriptions     Medication Sig Dispense Auth. Provider   ibuprofen (ADVIL) 600 MG tablet Take 1 tablet (600 mg total) by mouth 3 (three) times daily as needed. 30 tablet Ricci Paff, PA-C   acetaminophen (TYLENOL) 325 MG tablet Take 2 tablets (650 mg total) by mouth every 4 (four) hours as needed. 30 tablet Elijiah Mickley, PA-C   cyclobenzaprine (FLEXERIL) 10 MG tablet Take 1 tablet (10 mg total) by mouth 2 (two) times daily as needed for muscle spasms. 20 tablet Braleigh Massoud, Wells Guiles, PA-C      PDMP not reviewed this encounter.   Glena Pharris, Wells Guiles, PA-C 05/01/22 1350

## 2022-05-01 NOTE — Discharge Instructions (Addendum)
I recommend to alternate tylenol and ibuprofen every 4-6 hours for pain control.  You can take the muscle relaxer twice daily.  Apply hot pad to the neck. Gentle stretching of the neck.   Please allow several more days for symptoms to improve and resolve

## 2022-05-01 NOTE — ED Triage Notes (Signed)
Pt reports Rt neck pain and Rt arm pain.

## 2022-07-07 ENCOUNTER — Other Ambulatory Visit: Payer: Self-pay | Admitting: Obstetrics and Gynecology

## 2022-07-07 DIAGNOSIS — Z1231 Encounter for screening mammogram for malignant neoplasm of breast: Secondary | ICD-10-CM

## 2022-08-13 ENCOUNTER — Ambulatory Visit
Admission: RE | Admit: 2022-08-13 | Discharge: 2022-08-13 | Disposition: A | Discharge status: Home / Self Care | Payer: 59 | Source: Ambulatory Visit | Attending: Obstetrics and Gynecology | Admitting: Obstetrics and Gynecology

## 2022-08-13 ENCOUNTER — Ambulatory Visit: Payer: 59

## 2022-08-13 DIAGNOSIS — Z1231 Encounter for screening mammogram for malignant neoplasm of breast: Secondary | ICD-10-CM

## 2022-09-23 ENCOUNTER — Other Ambulatory Visit: Payer: Self-pay | Admitting: Obstetrics and Gynecology

## 2022-09-23 DIAGNOSIS — R928 Other abnormal and inconclusive findings on diagnostic imaging of breast: Secondary | ICD-10-CM

## 2022-09-24 ENCOUNTER — Ambulatory Visit (HOSPITAL_COMMUNITY)
Admission: EM | Admit: 2022-09-24 | Discharge: 2022-09-24 | Disposition: A | Discharge status: Home / Self Care | Payer: 59 | Attending: Internal Medicine | Admitting: Internal Medicine

## 2022-09-24 ENCOUNTER — Encounter (HOSPITAL_COMMUNITY): Payer: Self-pay

## 2022-09-24 DIAGNOSIS — J3089 Other allergic rhinitis: Secondary | ICD-10-CM

## 2022-09-24 MED ORDER — BENZONATATE 100 MG PO CAPS: 100.0000 mg | ORAL_CAPSULE | Freq: Three times a day (TID) | ORAL | 0 refills | Status: DC

## 2022-09-24 MED ORDER — CETIRIZINE HCL 10 MG PO TABS: 10.0000 mg | ORAL_TABLET | Freq: Every day | ORAL | 0 refills | Status: DC

## 2022-09-24 NOTE — ED Provider Notes (Signed)
[facility]    CSN: [id number] Arrival date & time: 09/24/22  1632      History   Chief Complaint Chief Complaint  Patient presents with   Cough    HPI Shawna Morris is a 47 y.o. female.   Patient presents to urgent care for evaluation of dry cough that started 3 days ago after she sprayed insecticide to her home 4 days ago. She wonders if her cough could be due to the use of insecticide but she is unsure.  No recent fever, chills, body aches, nasal congestion, rhinorrhea, watery/itchy eyes, eye drainage, vision changes, headache, dizziness, or shortness of breath.  Cough is dry and nonproductive.  No recent known sick contacts with similar symptoms.  She has used the insecticide in the past without reaction.  No rash or wheezing.  No history of chronic respiratory problems.  She has not attempted use of any over-the-counter medications to help with symptoms.   Cough   Past Medical History:  Diagnosis Date   Complication of anesthesia    states "last epidural did not work" wants to be "asleep"   No pertinent past medical history     There are no problems to display for this patient.   Past Surgical History:  Procedure Laterality Date   CESAREAN SECTION     TUBAL LIGATION      OB History     Gravida  3   Para  3   Term  3   Preterm  0   AB  0   Living  4      SAB  0   IAB  0   Ectopic  0   Multiple  1   Live Births  2            Home Medications    Prior to Admission medications   Medication Sig Start Date End Date Taking? Authorizing Provider  benzonatate (TESSALON) 100 MG capsule Take 1 capsule (100 mg total) by mouth every 8 (eight) hours. 09/24/22  Yes Carlisle Beers, FNP  cetirizine (ZYRTEC ALLERGY) 10 MG tablet Take 1 tablet (10 mg total) by mouth daily. 09/24/22  Yes Carlisle Beers, FNP  acetaminophen (TYLENOL) 325 MG tablet Take 2 tablets (650 mg total) by mouth every 4 (four) hours as needed. 05/01/22    Rising, Lurena Joiner, PA-C  cyclobenzaprine (FLEXERIL) 10 MG tablet Take 1 tablet (10 mg total) by mouth 2 (two) times daily as needed for muscle spasms. 05/01/22   Rising, Lurena Joiner, PA-C  ibuprofen (ADVIL) 600 MG tablet Take 1 tablet (600 mg total) by mouth 3 (three) times daily as needed. 05/01/22   Rising, Lurena Joiner, PA-C    Family History Family History  Problem Relation Age of Onset   Anesthesia problems Neg Hx    Breast cancer Neg Hx     Social History Social History   Tobacco Use   Smoking status: Never   Smokeless tobacco: Never  Vaping Use   Vaping status: Never Used  Substance Use Topics   Alcohol use: No   Drug use: No     Allergies   Aspirin   Review of Systems Review of Systems  Respiratory:  Positive for cough.   Per HPI   Physical Exam Triage Vital Signs ED Triage Vitals  Encounter Vitals Group     BP 09/24/22 1648 112/78     Systolic BP Percentile --      Diastolic BP Percentile --  Pulse Rate 09/24/22 1648 85     Resp 09/24/22 1648 16     Temp 09/24/22 1648 98.4 F (36.9 C)     Temp Source 09/24/22 1648 Oral     SpO2 09/24/22 1648 95 %     Weight --      Height --      Head Circumference --      Peak Flow --      Pain Score 09/24/22 1650 0     Pain Loc --      Pain Education --      Exclude from Growth Chart --    No data found.  Updated Vital Signs BP 112/78 (BP Location: Left Arm)   Pulse 85   Temp 98.4 F (36.9 C) (Oral)   Resp 16   LMP 09/18/2022 (Exact Date)   SpO2 95%   Visual Acuity Right Eye Distance:   Left Eye Distance:   Bilateral Distance:    Right Eye Near:   Left Eye Near:    Bilateral Near:     Physical Exam Vitals and nursing note reviewed.  Constitutional:      Appearance: She is not ill-appearing or toxic-appearing.  HENT:     Head: Normocephalic and atraumatic.     Right Ear: Hearing, tympanic membrane, ear canal and external ear normal.     Left Ear: Hearing, tympanic membrane, ear canal and external  ear normal.     Nose: Nose normal.     Mouth/Throat:     Lips: Pink.     Mouth: Mucous membranes are moist. No injury.     Tongue: No lesions. Tongue does not deviate from midline.     Palate: No mass and lesions.     Pharynx: Oropharynx is clear. Uvula midline. No pharyngeal swelling, oropharyngeal exudate, posterior oropharyngeal erythema or uvula swelling.     Tonsils: No tonsillar exudate or tonsillar abscesses.     Comments: Small amount of clear postnasal drainage visualized to the posterior oropharynx.  Eyes:     General: Lids are normal. Vision grossly intact. Gaze aligned appropriately.     Extraocular Movements: Extraocular movements intact.     Conjunctiva/sclera: Conjunctivae normal.  Cardiovascular:     Rate and Rhythm: Normal rate and regular rhythm.     Heart sounds: Normal heart sounds, S1 normal and S2 normal.  Pulmonary:     Effort: Pulmonary effort is normal. No respiratory distress.     Breath sounds: Normal breath sounds and air entry.  Musculoskeletal:     Cervical back: Neck supple.  Lymphadenopathy:     Cervical: No cervical adenopathy.  Skin:    General: Skin is warm and dry.     Capillary Refill: Capillary refill takes less than 2 seconds.     Findings: No rash.  Neurological:     General: No focal deficit present.     Mental Status: She is alert and oriented to person, place, and time. Mental status is at baseline.     Cranial Nerves: No dysarthria or facial asymmetry.  Psychiatric:        Mood and Affect: Mood normal.        Speech: Speech normal.        Behavior: Behavior normal.        Thought Content: Thought content normal.        Judgment: Judgment normal.      UC Treatments / Results  Labs (all labs ordered are listed, but only abnormal  results are displayed) Labs Reviewed - No data to display  EKG   Radiology No results found.  Procedures Procedures (including critical care time)  Medications Ordered in UC Medications - No  data to display  Initial Impression / Assessment and Plan / UC Course  I have reviewed the triage vital signs and the nursing notes.  Pertinent labs & imaging results that were available during my care of the patient were reviewed by me and considered in my medical decision making (see chart for details).   1.  Allergic rhinitis, unspecified trigger Allergic rhinitis will be treated with daily antihistamine and Flonase nasal spray. May use tessalon perles for cough. Low suspicion for viral URI etiology, no indication for viral testing. Lungs clear, therefore deferred imaging. Vital signs stable.   Counseled patient on potential for adverse effects with medications prescribed/recommended today, strict ER and return-to-clinic precautions discussed, patient verbalized understanding.   Final Clinical Impressions(s) / UC Diagnoses   Final diagnoses:  Viral URI with cough     Discharge Instructions      Your symptoms are likely due to environmental allergies.  Avoid exposure to allergens. Take oral antihistamine (Either Zyrtec, Claritin, or Allegra) and use Flonase daily as directed. You can buy these medications over the counter. These medications can take a few days to fully kick in to your body and start working. Return for any new or worsening symptoms.  If your eyes become itchy, you may purchase olopatadine (Pataday) eyedrops over the counter and use as directed to relieve watery/itchy eyes associated with allergies as well.   Tessalon perles as needed.  If your symptoms are severe, please go to the emergency room for further evaluation. Schedule an appointment with your primary care provider for follow-up and further management of your seasonal allergies as well as ongoing preventive healthcare.   ED Prescriptions     Medication Sig Dispense Auth. Provider   cetirizine (ZYRTEC ALLERGY) 10 MG tablet Take 1 tablet (10 mg total) by mouth daily. 30 tablet Reita May M, FNP    benzonatate (TESSALON) 100 MG capsule Take 1 capsule (100 mg total) by mouth every 8 (eight) hours. 21 capsule Carlisle Beers, FNP      PDMP not reviewed this encounter.   Carlisle Beers, [place] 09/24/22 1732

## 2022-09-24 NOTE — ED Triage Notes (Signed)
Patient c/o  non productive cough x 3 days.  Patient denies taking any medication for her cough.

## 2022-09-24 NOTE — Discharge Instructions (Signed)
Your symptoms are likely due to environmental allergies.  Avoid exposure to allergens. Take oral antihistamine (Either Zyrtec, Claritin, or Allegra) and use Flonase daily as directed. You can buy these medications over the counter. These medications can take a few days to fully kick in to your body and start working. Return for any new or worsening symptoms.  If your eyes become itchy, you may purchase olopatadine (Pataday) eyedrops over the counter and use as directed to relieve watery/itchy eyes associated with allergies as well.   Tessalon perles as needed.  If your symptoms are severe, please go to the emergency room for further evaluation. Schedule an appointment with your primary care provider for follow-up and further management of your seasonal allergies as well as ongoing preventive healthcare.

## 2022-10-04 ENCOUNTER — Ambulatory Visit: Admission: RE | Admit: 2022-10-04 | Payer: 59 | Source: Ambulatory Visit

## 2022-10-04 ENCOUNTER — Ambulatory Visit: Payer: 59

## 2022-10-04 ENCOUNTER — Ambulatory Visit
Admission: RE | Admit: 2022-10-04 | Discharge: 2022-10-04 | Disposition: A | Discharge status: Home / Self Care | Payer: 59 | Source: Ambulatory Visit | Attending: Obstetrics and Gynecology | Admitting: Obstetrics and Gynecology

## 2022-10-04 ENCOUNTER — Other Ambulatory Visit: Payer: 59

## 2022-10-04 DIAGNOSIS — R928 Other abnormal and inconclusive findings on diagnostic imaging of breast: Secondary | ICD-10-CM

## 2022-11-07 ENCOUNTER — Emergency Department (HOSPITAL_COMMUNITY): Payer: 59

## 2022-11-07 ENCOUNTER — Other Ambulatory Visit: Payer: Self-pay

## 2022-11-07 ENCOUNTER — Encounter (HOSPITAL_COMMUNITY): Payer: Self-pay

## 2022-11-07 ENCOUNTER — Emergency Department (HOSPITAL_COMMUNITY)
Admission: EM | Admit: 2022-11-07 | Discharge: 2022-11-07 | Disposition: A | Discharge status: Home / Self Care | Payer: 59 | Attending: Emergency Medicine | Admitting: Emergency Medicine

## 2022-11-07 DIAGNOSIS — K5100 Ulcerative (chronic) pancolitis without complications: Secondary | ICD-10-CM

## 2022-11-07 DIAGNOSIS — K5000 Crohn's disease of small intestine without complications: Secondary | ICD-10-CM | POA: Diagnosis not present

## 2022-11-07 DIAGNOSIS — R1084 Generalized abdominal pain: Secondary | ICD-10-CM | POA: Diagnosis present

## 2022-11-07 LAB — URINALYSIS, ROUTINE W REFLEX MICROSCOPIC
Bilirubin Urine: NEGATIVE
Glucose, UA: NEGATIVE mg/dL
Hgb urine dipstick: NEGATIVE
Ketones, ur: 5 mg/dL — AB
Leukocytes,Ua: NEGATIVE
Nitrite: NEGATIVE
Protein, ur: NEGATIVE mg/dL
Specific Gravity, Urine: 1.025 (ref 1.005–1.030)
pH: 5 (ref 5.0–8.0)

## 2022-11-07 LAB — COMPREHENSIVE METABOLIC PANEL
ALT: 20 U/L (ref 0–44)
AST: 24 U/L (ref 15–41)
Albumin: 4 g/dL (ref 3.5–5.0)
Alkaline Phosphatase: 86 U/L (ref 38–126)
Anion gap: 12 (ref 5–15)
BUN: 9 mg/dL (ref 6–20)
CO2: 22 mmol/L (ref 22–32)
Calcium: 9.3 mg/dL (ref 8.9–10.3)
Chloride: 103 mmol/L (ref 98–111)
Creatinine, Ser: 0.86 mg/dL (ref 0.44–1.00)
GFR, Estimated: >60 mL/min (ref >60)
Glucose, Bld: 121 mg/dL — ABNORMAL HIGH (ref 70–99)
Potassium: 4.4 mmol/L (ref 3.5–5.1)
Sodium: 137 mmol/L (ref 135–145)
Total Bilirubin: 0.9 mg/dL (ref 0.3–1.2)
Total Protein: 7.8 g/dL (ref 6.5–8.1)

## 2022-11-07 LAB — C DIFFICILE QUICK SCREEN W PCR REFLEX
C Diff antigen: NEGATIVE
C Diff interpretation: NOT DETECTED
C Diff toxin: NEGATIVE

## 2022-11-07 LAB — CBC
HCT: 42 % (ref 36.0–46.0)
Hemoglobin: 13.2 g/dL (ref 12.0–15.0)
MCH: 26.1 pg (ref 26.0–34.0)
MCHC: 31.4 g/dL (ref 30.0–36.0)
MCV: 83.2 fL (ref 80.0–100.0)
Platelets: 280 10*3/uL (ref 150–400)
RBC: 5.05 MIL/uL (ref 3.87–5.11)
RDW: 14.8 % (ref 11.5–15.5)
WBC: 6.7 10*3/uL (ref 4.0–10.5)
nRBC: 0 % (ref 0.0–0.2)

## 2022-11-07 LAB — LIPASE, BLOOD: Lipase: 26 U/L (ref 11–51)

## 2022-11-07 LAB — HCG, SERUM, QUALITATIVE: Preg, Serum: NEGATIVE

## 2022-11-07 MED ORDER — AMOXICILLIN-POT CLAVULANATE 875-125 MG PO TABS: 1.0000 | ORAL_TABLET | Freq: Two times a day (BID) | ORAL | 0 refills | Status: DC

## 2022-11-07 MED ORDER — ONDANSETRON HCL 4 MG PO TABS: 4.0000 mg | ORAL_TABLET | Freq: Four times a day (QID) | ORAL | 0 refills | Status: DC

## 2022-11-07 MED ORDER — LACTATED RINGERS IV BOLUS
1000.0000 mL | Freq: Once | INTRAVENOUS | Status: AC
  Administered 2022-11-07: 1000 mL via INTRAVENOUS

## 2022-11-07 MED ORDER — DICYCLOMINE HCL 10 MG PO CAPS
10.0000 mg | ORAL_CAPSULE | Freq: Once | ORAL | Status: AC
  Administered 2022-11-07: 10 mg via ORAL
  Filled 2022-11-07: qty 1

## 2022-11-07 MED ORDER — FENTANYL CITRATE PF 50 MCG/ML IJ SOSY
50.0000 ug | PREFILLED_SYRINGE | Freq: Once | INTRAMUSCULAR | Status: AC
  Administered 2022-11-07: 50 ug via INTRAVENOUS
  Filled 2022-11-07: qty 1

## 2022-11-07 MED ORDER — IOHEXOL 350 MG/ML SOLN
75.0000 mL | Freq: Once | INTRAVENOUS | Status: AC | PRN
  Administered 2022-11-07: 75 mL via INTRAVENOUS

## 2022-11-07 NOTE — Discharge Instructions (Addendum)
You were seen in the ER today for evaluation of your abdominal pain.  It was discovered that you have colitis.  This can be inflammatory or infectious.  I have spoken with your gastroenterologist and they would like for me to start you on a medication called Augmentin.  Please take as prescribed for the next week and complete the entirety of the course.  Please make sure you reach back out to [facility] as you may need to postpone your colonoscopy.  I recommend a bland diet make sure you are staying well-hydrated drinking plenty of fluids, mainly water.  If you are developing any fever, worsening belly pain, blood in your stool, vomiting, joint swelling, please return to your nearest emerged part for evaluation.  If you have any other concerns, new or worsening symptoms, please return to the nearest emergency department for evaluation.  Contact a health care provider if: Your symptoms do not go away. You develop new symptoms. Get help right away if: You have a fever that does not go away with treatment. You develop chills. You have extreme weakness, fainting, or dehydration. You vomit repeatedly. You develop severe pain in your abdomen. You pass bloody or tarry stool.

## 2022-11-07 NOTE — ED Triage Notes (Signed)
Pt arrived POV from home c/o generalized abdominal pain nausea and diarrhea that started at 6am.

## 2022-11-07 NOTE — ED Provider Notes (Signed)
[place] [facility] AT [facility] Provider Note   CSN: [id number] Arrival date & time: 11/07/22  1321     History Chief Complaint  Patient presents with   Abdominal Pain    Shawna Morris is a 47 y.o. female reportedly otherwise healthy presents emerged part today for evaluation of diffuse abdominal pain with multiple episodes of diarrhea.  Patient reports initially started this morning around 0600 and it was in the left lower quadrant and then became diffusely in her abdomen.  Reports it as a cramping sensation.  Reports some nausea with between 5-10 episodes of diarrhea.  She denies any melena or any hematochezia.  She reports that her stool is brown in color.  She denies any dysuria, hematuria, vaginal discharge, vaginal bleeding, or any fevers.  She reports that last night she had some hot chocolate and cheese as well as some chicken from a [employer].  She also took some Pepto-Bismol earlier without much relief.  Denies any daily medications.  She is allergic to aspirin but can take ibuprofen.  Denies any tobacco, EtOH, or drug use.   Abdominal Pain Associated symptoms: diarrhea and nausea   Associated symptoms: no chest pain, no chills, no constipation, no dysuria, no fever, no hematuria, no shortness of breath, no vaginal bleeding, no vaginal discharge and no vomiting        Home Medications Prior to Admission medications   Medication Sig Start Date End Date Taking? Authorizing Provider  amoxicillin-clavulanate (AUGMENTIN) 875-125 MG tablet Take 1 tablet by mouth every 12 (twelve) hours. 11/07/22  Yes Achille Rich, PA-C  ondansetron (ZOFRAN) 4 MG tablet Take 1 tablet (4 mg total) by mouth every 6 (six) hours. 11/07/22  Yes Achille Rich, PA-C  acetaminophen (TYLENOL) 325 MG tablet Take 2 tablets (650 mg total) by mouth every 4 (four) hours as needed. 05/01/22   Rising, Lurena Joiner, PA-C  benzonatate (TESSALON) 100 MG capsule Take 1 capsule (100 mg total) by  mouth every 8 (eight) hours. 09/24/22   Carlisle Beers, FNP  cetirizine (ZYRTEC ALLERGY) 10 MG tablet Take 1 tablet (10 mg total) by mouth daily. 09/24/22   Carlisle Beers, FNP  cyclobenzaprine (FLEXERIL) 10 MG tablet Take 1 tablet (10 mg total) by mouth 2 (two) times daily as needed for muscle spasms. 05/01/22   Rising, Lurena Joiner, PA-C  ibuprofen (ADVIL) 600 MG tablet Take 1 tablet (600 mg total) by mouth 3 (three) times daily as needed. 05/01/22   Rising, Lurena Joiner, PA-C      Allergies    Aspirin    Review of Systems   Review of Systems  Constitutional:  Negative for chills and fever.  Respiratory:  Negative for shortness of breath.   Cardiovascular:  Negative for chest pain.  Gastrointestinal:  Positive for abdominal pain, diarrhea and nausea. Negative for constipation and vomiting.  Genitourinary:  Negative for dysuria, hematuria, vaginal bleeding and vaginal discharge.    Physical Exam Updated Vital Signs BP (!) 140/98   Pulse 88   Temp 98.4 F (36.9 C) (Oral)   Resp 16   Ht 5\' 6"  (1.676 m)   Wt 105.7 kg   SpO2 100%   BMI 37.61 kg/m  Physical Exam Vitals and nursing note reviewed.  Constitutional:      General: She is not in acute distress.    Appearance: Normal appearance. She is not ill-appearing or toxic-appearing.  Eyes:     General: No scleral icterus. Cardiovascular:     Rate and  Rhythm: Normal rate and regular rhythm.  Pulmonary:     Effort: Pulmonary effort is normal. No respiratory distress.     Breath sounds: Normal breath sounds.  Abdominal:     General: Bowel sounds are normal.     Palpations: Abdomen is soft.     Tenderness: There is generalized abdominal tenderness. There is no right CVA tenderness, left CVA tenderness, guarding or rebound.     Comments: Normal active bowel sounds.  Diffuse abdominal tenderness to palpation.  Soft.  No guarding or rebound noted.  Musculoskeletal:        General: No deformity.     Cervical back: Normal range of  motion.  Skin:    General: Skin is warm and dry.  Neurological:     General: No focal deficit present.     Mental Status: She is alert. Mental status is at baseline.     ED Results / Procedures / Treatments   Labs (all labs ordered are listed, but only abnormal results are displayed) Labs Reviewed  COMPREHENSIVE METABOLIC PANEL - Abnormal; Notable for the following components:      Result Value   Glucose, Bld 121 (*)    All other components within normal limits  URINALYSIS, ROUTINE W REFLEX MICROSCOPIC - Abnormal; Notable for the following components:   APPearance HAZY (*)    Ketones, ur 5 (*)    All other components within normal limits  GASTROINTESTINAL PANEL BY PCR, STOOL (REPLACES STOOL CULTURE)  CALPROTECTIN, FECAL  C DIFFICILE QUICK SCREEN W PCR REFLEX    LIPASE, BLOOD  CBC  HCG, SERUM, QUALITATIVE    EKG None  Radiology CT ABDOMEN PELVIS W CONTRAST  Result Date: 11/07/2022 CLINICAL DATA:  Acute nonlocalized abdominal pain EXAM: CT ABDOMEN AND PELVIS WITH CONTRAST TECHNIQUE: Multidetector CT imaging of the abdomen and pelvis was performed using the standard protocol following bolus administration of intravenous contrast. RADIATION DOSE REDUCTION: This exam was performed according to the departmental dose-optimization program which includes automated exposure control, adjustment of the mA and/or kV according to patient size and/or use of iterative reconstruction technique. CONTRAST:  75mL OMNIPAQUE IOHEXOL 350 MG/ML SOLN COMPARISON:  04/17/2015 FINDINGS: Lower chest: No acute abnormality. Hepatobiliary: Unremarkable liver. Normal gallbladder. No biliary dilation. Pancreas: Unremarkable. Spleen: Unremarkable. Adrenals/Urinary Tract: Normal adrenal glands. No urinary calculi or hydronephrosis. Bladder is nondistended. Stomach/Bowel: Normal caliber large and small bowel. Mild diffuse wall thickening throughout the colon with mild mucosal hyperenhancement. There is additional mild  wall thickening of the terminal ileum. The appendix is normal.Stomach is within normal limits. Vascular/Lymphatic: No significant vascular findings are present. No enlarged abdominal or pelvic lymph nodes. Reproductive: Unremarkable. Other: Small volume free fluid in the pelvis. No free intraperitoneal air. Musculoskeletal: No acute fracture. IMPRESSION: Mild infectious/inflammatory pancolitis and terminal ileitis. Electronically Signed   By: Minerva Fester M.D.   On: 11/07/2022 17:29    Procedures Procedures   Medications Ordered in ED Medications  dicyclomine (BENTYL) capsule 10 mg (10 mg Oral Given 11/07/22 1554)  lactated ringers bolus 1,000 mL (0 mLs Intravenous Stopped 11/07/22 2005)  fentaNYL (SUBLIMAZE) injection 50 mcg (50 mcg Intravenous Given 11/07/22 1553)  iohexol (OMNIPAQUE) 350 MG/ML injection 75 mL (75 mLs Intravenous Contrast Given 11/07/22 1640)    ED Course/ Medical Decision Making/ A&P   Medical Decision Making Amount and/or Complexity of Data Reviewed Labs: ordered. Radiology: ordered.  Risk Prescription drug management.   47 y.o. female presents to the ER for evaluation of abdominal pain with  diarrhea. Differential diagnosis includes but is not limited to AAA, mesenteric ischemia, appendicitis, diverticulitis, DKA, gastroenteritis, nephrolithiasis, pancreatitis, constipation, UTI, bowel obstruction, biliary disease, IBD, PUD, hepatitis, ectopic pregnancy, ovarian torsion, PID. Vital signs mildly elevated blood pressure otherwise unremarkable. Physical exam as noted above.   Patient received 1 L IV fluids with Bentyl and fentanyl for pain.  I independently reviewed and interpreted the patient's labs. CMP shows mildly elevated glucose at 121 otherwise no electrolyte or LFT abnormality.  CBC without leukocytosis or anemia.  Lipase within normal limits.  hCG is negative.  Urinalysis shows hazy urine with some ketones otherwise no signs of infection.   CT scan shows mild  infectious/inflammatory pancolitis and terminal ileitis.  I consulted [facility] and spoke with Dr. Lorenso Quarry.  She recommended starting the patient on Augmentin.  She did ask that I order fecal stool studies to be done so that she can follow-up with them outpatient.  The patient is beginning a colonoscopy done on the fifth, she she reports that her tumor reaching out as they may need to reschedule the colonoscopy.  No other recommendations at this time.  Fortunately, the patient was able to provide a stool sample.  Will start her on Augmentin and recommend bland diet.  I discussed with her the conversation I had with Dr. Lorenso Quarry.  Discussed that she will likely need to contact them to possibly reschedule.  Will send her home with some Augmentin and Zofran for some nausea.  The patient does have some mildly elevated blood pressure but is otherwise hemodynamically stable.  She does not have any leukocytosis or significant derangement in her LFTs.  They do not see any diverticulitis or appendicitis on her CT scan.  Her pain is likely from the colitis seen.  Question IBD versus an infection.  She does have a colonoscopy scheduled soon interim starting her on Augmentin for presumed infection per Dr.  Earlie Counts recommendation. Stable for discharge home.   We discussed the results of the labs/imaging. The plan is take medication as prescribed, bland diet, follow-up with GI. We discussed strict return precautions and red flag symptoms. The patient verbalized their understanding and agrees to the plan. The patient is stable and being discharged home in good condition.  Portions of this report may have been transcribed using voice recognition software. Every effort was made to ensure accuracy; however, inadvertent computerized transcription errors may be present.   Final Clinical Impression(s) / ED Diagnoses Final diagnoses:  Pancolitis (HCC)  Terminal ileitis without complication (HCC)    Rx / DC Orders ED  Discharge Orders          Ordered    amoxicillin-clavulanate (AUGMENTIN) 875-125 MG tablet  Every 12 hours        11/07/22 2052    ondansetron (ZOFRAN) 4 MG tablet  Every 6 hours        11/07/22 2102              Achille Rich, PA-C 11/07/22 2106    Terrilee Files, MD 11/08/22 1045

## 2022-11-08 LAB — GASTROINTESTINAL PANEL BY PCR, STOOL (REPLACES STOOL CULTURE)

## 2022-11-10 LAB — CALPROTECTIN, FECAL: Calprotectin, Fecal: 327 ug/g — ABNORMAL HIGH (ref 0–120)

## 2023-09-06 ENCOUNTER — Ambulatory Visit (HOSPITAL_COMMUNITY): Admission: EM | Admit: 2023-09-06 | Discharge: 2023-09-06 | Disposition: A | Discharge status: Home / Self Care

## 2023-09-06 ENCOUNTER — Encounter (HOSPITAL_COMMUNITY): Payer: Self-pay

## 2023-09-06 DIAGNOSIS — L235 Allergic contact dermatitis due to other chemical products: Secondary | ICD-10-CM

## 2023-09-06 MED ORDER — TRIAMCINOLONE ACETONIDE 0.1 % EX OINT: 1.0000 | TOPICAL_OINTMENT | Freq: Two times a day (BID) | CUTANEOUS | 1 refills | Status: DC

## 2023-09-06 MED ORDER — FEXOFENADINE HCL 180 MG PO TABS: 180.0000 mg | ORAL_TABLET | Freq: Every day | ORAL | 0 refills | Status: DC

## 2023-09-06 NOTE — ED Provider Notes (Signed)
 [facility]  Note:  This document was prepared using Dragon voice recognition software and may include unintentional dictation errors.  MRN: [id number] DOB: Sep 15, 1975  Subjective:   Shawna Morris is a 48 y.o. female presenting for rash to bilateral forearms x 3 days.  Patient reports that she used a different laundry detergent which she believes may have caused rash to occur.  Patient has been using over-the-counter hydrocortisone and A&E ointment with some relief to the itching however the rash has not resolved.  Patient denies any pain, drainage from the skin, persistent itching.  Patient denies any other secondary symptoms at this time.  No current facility-administered medications for this encounter.  Current Outpatient Medications:    fexofenadine (ALLEGRA) 180 MG tablet, Take 1 tablet (180 mg total) by mouth daily., Disp: 30 tablet, Rfl: 0   triamcinolone ointment (KENALOG) 0.1 %, Apply 1 Application topically 2 (two) times daily., Disp: 30 g, Rfl: 1   Allergies  Allergen Reactions   Aspirin Other (See Comments)    Bleeding. No problem with ibuprofen      Past Medical History:  Diagnosis Date   Complication of anesthesia    states last epidural did not work wants to be asleep   No pertinent past medical history      Past Surgical History:  Procedure Laterality Date   CESAREAN SECTION     TUBAL LIGATION      Family History  Problem Relation Age of Onset   Anesthesia problems Neg Hx    Breast cancer Neg Hx     Social History   Tobacco Use   Smoking status: Never   Smokeless tobacco: Never  Vaping Use   Vaping status: Never Used  Substance Use Topics   Alcohol use: No   Drug use: No    ROS Refer to HPI for ROS details.  Objective:   Vitals: BP 106/70 (BP Location: Right Arm)   Pulse 70   Temp 98.6 F (37 C) (Oral)   Resp 18   LMP 08/08/2023   SpO2 97%   Breastfeeding No   Physical Exam Vitals and nursing note reviewed.   Constitutional:      General: She is not in acute distress.    Appearance: Normal appearance. She is not ill-appearing.  HENT:     Head: Normocephalic.   Cardiovascular:     Rate and Rhythm: Normal rate.  Pulmonary:     Effort: Pulmonary effort is normal. No respiratory distress.   Skin:    General: Skin is warm and dry.     Capillary Refill: Capillary refill takes less than 2 seconds.     Findings: Rash present. No erythema. Rash is papular.   Neurological:     General: No focal deficit present.     Mental Status: She is alert and oriented to person, place, and time.   Psychiatric:        Mood and Affect: Mood normal.        Behavior: Behavior normal.     Procedures  No results found for this or any previous visit (from the past 24 hours).  No results found.   Assessment and Plan :     Discharge Instructions       1. Allergic dermatitis due to other chemical product (Primary) - triamcinolone ointment (KENALOG) 0.1 %; Apply 1 Application topically 2 (two) times daily for contact dermatitis rash. - fexofenadine (ALLEGRA) 180 MG tablet; Take 1 tablet (180 mg total) by mouth daily for  acute reaction to allergic substance - Continue using A&E ointment for itching and irritation. - Continue monitoring site for any change in severity if there is any escalation of current symptoms or development of new symptoms follow-up for further evaluation management.      Dejour Vos B Rie Mcneil   Taylormarie Register, [place], [place] 09/06/23 [phone]

## 2023-09-06 NOTE — ED Triage Notes (Signed)
 Pt c/o rash to both arms x3 days. States used a [employer] detergent. States used A&D ointment with relief.

## 2023-09-06 NOTE — Discharge Instructions (Addendum)
  1. Allergic dermatitis due to other chemical product (Primary) - triamcinolone ointment (KENALOG) 0.1 %; Apply 1 Application topically 2 (two) times daily for contact dermatitis rash. - fexofenadine (ALLEGRA) 180 MG tablet; Take 1 tablet (180 mg total) by mouth daily for acute reaction to allergic substance - Continue using A&E ointment for itching and irritation. - Continue monitoring site for any change in severity if there is any escalation of current symptoms or development of new symptoms follow-up for further evaluation management.

## 2023-09-29 ENCOUNTER — Other Ambulatory Visit: Payer: Self-pay | Admitting: Obstetrics & Gynecology

## 2023-09-29 DIAGNOSIS — Z1231 Encounter for screening mammogram for malignant neoplasm of breast: Secondary | ICD-10-CM

## 2023-10-13 ENCOUNTER — Ambulatory Visit
Admission: RE | Admit: 2023-10-13 | Discharge: 2023-10-13 | Disposition: A | Discharge status: Home / Self Care | Source: Ambulatory Visit | Attending: Obstetrics & Gynecology | Admitting: Obstetrics & Gynecology

## 2023-10-13 DIAGNOSIS — Z1231 Encounter for screening mammogram for malignant neoplasm of breast: Secondary | ICD-10-CM

## 2024-02-12 ENCOUNTER — Encounter (HOSPITAL_COMMUNITY): Payer: Self-pay | Admitting: Emergency Medicine

## 2024-02-12 ENCOUNTER — Telehealth (HOSPITAL_COMMUNITY): Payer: Self-pay

## 2024-02-12 ENCOUNTER — Ambulatory Visit (HOSPITAL_COMMUNITY)
Admission: EM | Admit: 2024-02-12 | Discharge: 2024-02-12 | Disposition: A | Discharge status: Home / Self Care | Attending: Internal Medicine | Admitting: Internal Medicine

## 2024-02-12 DIAGNOSIS — R1012 Left upper quadrant pain: Secondary | ICD-10-CM

## 2024-02-12 DIAGNOSIS — M62830 Muscle spasm of back: Secondary | ICD-10-CM

## 2024-02-12 DIAGNOSIS — R0789 Other chest pain: Secondary | ICD-10-CM

## 2024-02-12 LAB — POCT URINE DIPSTICK
Bilirubin, UA: NEGATIVE
Blood, UA: NEGATIVE
Glucose, UA: NEGATIVE mg/dL
Leukocytes, UA: NEGATIVE
Nitrite, UA: NEGATIVE
Protein Ur, POC: 30 mg/dL — AB
Spec Grav, UA: 1.02 (ref 1.010–1.025)
Urobilinogen, UA: 1 U/dL
pH, UA: 8.5 — AB (ref 5.0–8.0)

## 2024-02-12 MED ORDER — BACLOFEN 10 MG PO TABS: 10.0000 mg | ORAL_TABLET | Freq: Three times a day (TID) | ORAL | 0 refills | Status: DC

## 2024-02-12 MED ORDER — BACLOFEN 10 MG PO TABS: 10.0000 mg | ORAL_TABLET | Freq: Three times a day (TID) | ORAL | 0 refills | Status: AC

## 2024-02-12 NOTE — ED Triage Notes (Signed)
 Pt c/o left abd pain for 3 days. Took ibuprofen  before went to bed last night. Little nausea denies vomiting, urinary or bowel problems.

## 2024-02-12 NOTE — Discharge Instructions (Signed)
 Your symptoms are likely due to a pulled muscle.  Pulled muscles typically heal on their own in several days.  You may take tylenol  as needed for aches and pains.  You may take ibuprofen  600mg  every 6 hours as needed for aches and pains.   Take muscle relaxer as needed for muscle spasm, mostly take this at bedtime as this medicine can cause drowsiness.  Apply heat to the pulled muscle 20 minutes on 20 minutes off as needed, heat relaxes muscles.  Perform gentle exercises and stretches to area of tenderness.  I would like for you to rest, however I do not want you to avoid moving the area. Movement and stretching will help with healing.  If you develop any new or worsening symptoms or if your symptoms do not start to improve, please return here or follow-up with your primary care provider. If your symptoms are severe, please go to the emergency room.

## 2024-02-12 NOTE — ED Provider Notes (Incomplete)
 [facility]    CSN: [id number] Arrival date & time: 02/12/24  1825      History   Chief Complaint Chief Complaint  Patient presents with  . Abdominal Pain    HPI Shawna Morris is a 48 y.o. female.     Burning pain to the left ribcage/left upper abdomen that  goes through the body and into the back/flank area Stooling normal, no blood in the stool No urinary symptoms No epigastric pain No acid reflux No cough/SOB No upper chest pain Worse with movement, gets better No rashes to the side  [place] [employer] [place] days ago and carried quite a lot of stuff on the left side of the body walking around No other injuries Ibuprofen  last night and this helped her sleep last night She woke up this morning and hte pain was alittle better but this evening/throughout the day the pain has gradually gotten worse 8/10  No falls or injuries    Abdominal Pain   Past Medical History:  Diagnosis Date  . Complication of anesthesia    states last epidural did not work wants to be asleep  . No pertinent past medical history     There are no active problems to display for this patient.   Past Surgical History:  Procedure Laterality Date  . CESAREAN SECTION    . TUBAL LIGATION      OB History     Gravida  3   Para  3   Term  3   Preterm  0   AB  0   Living  4      SAB  0   IAB  0   Ectopic  0   Multiple  1   Live Births  2            Home Medications    Prior to Admission medications   Not on File    Family History Family History  Problem Relation Age of Onset  . Anesthesia problems Neg Hx   . Breast cancer Neg Hx   . BRCA 1/2 Neg Hx     Social History Social History   Tobacco Use  . Smoking status: Never  . Smokeless tobacco: Never  Vaping Use  . Vaping status: Never Used  Substance Use Topics  . Alcohol use: No  . Drug use: No     Allergies   Aspirin   Review of Systems Review of Systems  Gastrointestinal:   Positive for abdominal pain.     Physical Exam Triage Vital Signs ED Triage Vitals  Encounter Vitals Group     BP 02/12/24 1910 133/81     Girls Systolic BP Percentile --      Girls Diastolic BP Percentile --      Boys Systolic BP Percentile --      Boys Diastolic BP Percentile --      Pulse Rate 02/12/24 1910 77     Resp 02/12/24 1910 16     Temp 02/12/24 1910 98.6 F (37 C)     Temp Source 02/12/24 1910 Oral     SpO2 02/12/24 1910 98 %     Weight --      Height --      Head Circumference --      Peak Flow --      Pain Score 02/12/24 1909 8     Pain Loc --      Pain Education --  Exclude from Growth Chart --    No data found.  Updated Vital Signs BP 133/81 (BP Location: Right Arm)   Pulse 77   Temp 98.6 F (37 C) (Oral)   Resp 16   SpO2 98%   Visual Acuity Right Eye Distance:   Left Eye Distance:   Bilateral Distance:    Right Eye Near:   Left Eye Near:    Bilateral Near:     Physical Exam   UC Treatments / Results  Labs (all labs ordered are listed, but only abnormal results are displayed) Labs Reviewed  POCT URINE DIPSTICK - Abnormal; Notable for the following components:      Result Value   Ketones, POC UA trace (5) (*)    pH, UA 8.5 (*)    Protein Ur, POC =30 (*)    All other components within normal limits    EKG   Radiology No results found.  Procedures Procedures (including critical care time)  Medications Ordered in UC Medications - No data to display  Initial Impression / Assessment and Plan / UC Course  I have reviewed the triage vital signs and the nursing notes.  Pertinent labs & imaging results that were available during my care of the patient were reviewed by me and considered in my medical decision making (see chart for details).     *** Final Clinical Impressions(s) / UC Diagnoses   Final diagnoses:  Abdominal pain, left lower quadrant   Discharge Instructions   None    ED Prescriptions   None    PDMP  not reviewed this encounter.

## 2024-02-16 NOTE — Telephone Encounter (Signed)
 Created in error
